# Patient Record
Sex: Female | Born: 1985 | Race: White | Hispanic: No | Marital: Single | Smoking: Current every day smoker
Health system: Southern US, Community
[De-identification: ages and names within clinical notes are randomized; demographics above are authoritative.]

## PROBLEM LIST (undated history)

## (undated) HISTORY — PX: WISDOM TOOTH EXTRACTION: SHX21

---

## 2016-05-21 ENCOUNTER — Ambulatory Visit (HOSPITAL_COMMUNITY)
Admission: RE | Admit: 2016-05-21 | Discharge: 2016-05-21 | Disposition: A | Discharge status: Home / Self Care | Payer: Self-pay | Attending: Psychiatry | Admitting: Psychiatry

## 2016-05-21 DIAGNOSIS — F411 Generalized anxiety disorder: Secondary | ICD-10-CM | POA: Insufficient documentation
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
## 2016-05-21 NOTE — H&P (Signed)
Behavioral Health Medical Screening Exam  Diana Meyer is an 30 y.o. female who presents as a walk in requesting help with depression and desiring outpatient resources for the [place] area. Reports medical history to include ovarian cysts. Denies SI/HI. Patient is stable to discharge with outpatient services.    Total Time spent with patient: 20 minutes  Psychiatric Specialty Exam: Physical Exam  Constitutional: She is oriented to person, place, and time. She appears well-developed and well-nourished.  HENT:  Head: Normocephalic and atraumatic.  Right Ear: External ear normal.  Left Ear: External ear normal.  Neck: Normal range of motion.  Cardiovascular: Normal rate, regular rhythm and normal heart sounds.   Respiratory: Effort normal and breath sounds normal.  GI: Soft. Bowel sounds are normal.  Musculoskeletal: Normal range of motion.  Neurological: She is alert and oriented to person, place, and time.  Skin: Skin is warm and dry.    Review of Systems  Constitutional: Negative for chills, fever and weight loss.  HENT: Negative for congestion, ear discharge, ear pain and sinus pain.   Eyes: Negative for blurred vision, double vision and pain.  Respiratory: Negative for cough, shortness of breath and wheezing.   Cardiovascular: Negative for chest pain, palpitations and leg swelling.  Gastrointestinal: Negative for abdominal pain, heartburn, nausea and vomiting.  Genitourinary: Negative for dysuria and urgency.  Musculoskeletal: Negative for myalgias and neck pain.  Skin: Negative for itching and rash.  Psychiatric/Behavioral: Positive for depression. The patient is nervous/anxious.     Blood pressure 114/81, pulse 94, temperature 99.1 F (37.3 C), resp. rate 18.There is no height or weight on file to calculate BMI.  General Appearance: Casual  Eye Contact:  Good  Speech:  Clear and Coherent  Volume:  Normal  Mood:  Anxious  Affect:  Appropriate  Thought Process:   Coherent and Goal Directed  Orientation:  Full (Time, Place, and Person)  Thought Content:  Symptoms, worries, concerns   Suicidal Thoughts:  No  Homicidal Thoughts:  No  Memory:  Immediate;   Good Recent;   Good Remote;   Good  Judgement:  Intact  Insight:  Present  Psychomotor Activity:  Normal  Concentration: Concentration: Good and Attention Span: Good  Recall:  Good  Fund of Knowledge:Good  Language: Good  Akathisia:  No  Handed:  Right  AIMS (if indicated):     Assets:  Communication Skills Desire for Improvement Housing Intimacy Leisure Time Physical Health Resilience Social Support  Sleep:       Musculoskeletal: Strength & Muscle Tone: within normal limits Gait & Station: normal Patient leans: N/A  Blood pressure 114/81, pulse 94, temperature 99.1 F (37.3 C), resp. rate 18.  Recommendations:  Based on my evaluation the patient does not appear to have an emergency medical condition.  Fransisca KaufmannAVIS, Waylin Dorko, NP 05/21/2016, 6:27 PM

## 2017-05-30 ENCOUNTER — Emergency Department (HOSPITAL_COMMUNITY)
Admission: EM | Admit: 2017-05-30 | Discharge: 2017-05-30 | Disposition: A | Discharge status: Home / Self Care | Payer: Self-pay | Attending: Emergency Medicine | Admitting: Emergency Medicine

## 2017-05-30 ENCOUNTER — Other Ambulatory Visit: Payer: Self-pay

## 2017-05-30 ENCOUNTER — Emergency Department (HOSPITAL_COMMUNITY): Payer: Self-pay

## 2017-05-30 ENCOUNTER — Encounter (HOSPITAL_COMMUNITY): Payer: Self-pay | Admitting: Emergency Medicine

## 2017-05-30 DIAGNOSIS — M25571 Pain in right ankle and joints of right foot: Secondary | ICD-10-CM | POA: Insufficient documentation

## 2017-05-30 DIAGNOSIS — S0083XA Contusion of other part of head, initial encounter: Secondary | ICD-10-CM | POA: Insufficient documentation

## 2017-05-30 DIAGNOSIS — Y929 Unspecified place or not applicable: Secondary | ICD-10-CM | POA: Insufficient documentation

## 2017-05-30 DIAGNOSIS — Z23 Encounter for immunization: Secondary | ICD-10-CM | POA: Insufficient documentation

## 2017-05-30 DIAGNOSIS — Z79899 Other long term (current) drug therapy: Secondary | ICD-10-CM | POA: Insufficient documentation

## 2017-05-30 DIAGNOSIS — G44319 Acute post-traumatic headache, not intractable: Secondary | ICD-10-CM | POA: Insufficient documentation

## 2017-05-30 DIAGNOSIS — Y999 Unspecified external cause status: Secondary | ICD-10-CM | POA: Insufficient documentation

## 2017-05-30 DIAGNOSIS — R109 Unspecified abdominal pain: Secondary | ICD-10-CM | POA: Insufficient documentation

## 2017-05-30 DIAGNOSIS — F17210 Nicotine dependence, cigarettes, uncomplicated: Secondary | ICD-10-CM | POA: Insufficient documentation

## 2017-05-30 DIAGNOSIS — M79621 Pain in right upper arm: Secondary | ICD-10-CM

## 2017-05-30 DIAGNOSIS — S41112A Laceration without foreign body of left upper arm, initial encounter: Secondary | ICD-10-CM | POA: Insufficient documentation

## 2017-05-30 DIAGNOSIS — M79642 Pain in left hand: Secondary | ICD-10-CM

## 2017-05-30 DIAGNOSIS — Y939 Activity, unspecified: Secondary | ICD-10-CM | POA: Insufficient documentation

## 2017-05-30 DIAGNOSIS — S300XXA Contusion of lower back and pelvis, initial encounter: Secondary | ICD-10-CM | POA: Insufficient documentation

## 2017-05-30 DIAGNOSIS — T07XXXA Unspecified multiple injuries, initial encounter: Secondary | ICD-10-CM

## 2017-05-30 LAB — RAPID URINE DRUG SCREEN, HOSP PERFORMED
AMPHETAMINES: POSITIVE — AB
BENZODIAZEPINES: POSITIVE — AB
Barbiturates: NOT DETECTED
Cocaine: NOT DETECTED
OPIATES: POSITIVE — AB
TETRAHYDROCANNABINOL: POSITIVE — AB

## 2017-05-30 LAB — COMPREHENSIVE METABOLIC PANEL
ALT: 14 U/L (ref 14–54)
AST: 24 U/L (ref 15–41)
Albumin: 4.3 g/dL (ref 3.5–5.0)
Alkaline Phosphatase: 59 U/L (ref 38–126)
Anion gap: 15 (ref 5–15)
BILIRUBIN TOTAL: 0.8 mg/dL (ref 0.3–1.2)
BUN: 14 mg/dL (ref 6–20)
CHLORIDE: 103 mmol/L (ref 101–111)
CO2: 22 mmol/L (ref 22–32)
CREATININE: 0.88 mg/dL (ref 0.44–1.00)
Calcium: 9.4 mg/dL (ref 8.9–10.3)
GFR calc Af Amer: >60 mL/min (ref >60)
Glucose, Bld: 86 mg/dL (ref 65–99)
POTASSIUM: 3.9 mmol/L (ref 3.5–5.1)
Sodium: 140 mmol/L (ref 135–145)
TOTAL PROTEIN: 7.4 g/dL (ref 6.5–8.1)

## 2017-05-30 LAB — URINALYSIS, ROUTINE W REFLEX MICROSCOPIC
Bilirubin Urine: NEGATIVE
GLUCOSE, UA: NEGATIVE mg/dL
HGB URINE DIPSTICK: NEGATIVE
Ketones, ur: 80 mg/dL — AB
NITRITE: NEGATIVE
PROTEIN: NEGATIVE mg/dL
pH: 5 (ref 5.0–8.0)

## 2017-05-30 LAB — CBC
HCT: 41 % (ref 36.0–46.0)
Hemoglobin: 14.1 g/dL (ref 12.0–15.0)
MCH: 31.1 pg (ref 26.0–34.0)
MCHC: 34.4 g/dL (ref 30.0–36.0)
MCV: 90.5 fL (ref 78.0–100.0)
Platelets: 369 10*3/uL (ref 150–400)
RBC: 4.53 MIL/uL (ref 3.87–5.11)
RDW: 14.5 % (ref 11.5–15.5)
WBC: 12.1 10*3/uL — AB (ref 4.0–10.5)

## 2017-05-30 LAB — I-STAT TROPONIN, ED: Troponin i, poc: 0 ng/mL (ref 0.00–0.08)

## 2017-05-30 LAB — ETHANOL

## 2017-05-30 LAB — LIPASE, BLOOD: Lipase: 17 U/L (ref 11–51)

## 2017-05-30 LAB — SALICYLATE LEVEL: SALICYLATE LVL: 24.1 mg/dL (ref 2.8–30.0)

## 2017-05-30 LAB — ACETAMINOPHEN LEVEL: Acetaminophen (Tylenol), Serum: <10 ug/mL — ABNORMAL LOW (ref 10–30)

## 2017-05-30 LAB — I-STAT BETA HCG BLOOD, ED (MC, WL, AP ONLY)

## 2017-05-30 MED ORDER — TETANUS-DIPHTH-ACELL PERTUSSIS 5-2.5-18.5 LF-MCG/0.5 IM SUSP
0.5000 mL | Freq: Once | INTRAMUSCULAR | Status: AC
Start: 2017-05-30 — End: 2017-05-30
  Administered 2017-05-30: 0.5 mL via INTRAMUSCULAR
  Filled 2017-05-30: qty 0.5

## 2017-05-30 MED ORDER — DIPHENHYDRAMINE HCL 50 MG/ML IJ SOLN
50.0000 mg | Freq: Once | INTRAMUSCULAR | Status: AC
  Administered 2017-05-30: 50 mg via INTRAVENOUS
  Filled 2017-05-30: qty 1

## 2017-05-30 MED ORDER — ONDANSETRON 4 MG PO TBDP
4.0000 mg | ORAL_TABLET | Freq: Once | ORAL | Status: AC | PRN
  Administered 2017-05-30: 4 mg via ORAL
  Filled 2017-05-30: qty 1

## 2017-05-30 MED ORDER — MORPHINE SULFATE (PF) 4 MG/ML IV SOLN
4.0000 mg | Freq: Once | INTRAVENOUS | Status: AC
  Administered 2017-05-30: 4 mg via INTRAVENOUS
  Filled 2017-05-30: qty 1

## 2017-05-30 MED ORDER — PROMETHAZINE HCL 25 MG/ML IJ SOLN
25.0000 mg | Freq: Once | INTRAMUSCULAR | Status: AC
  Administered 2017-05-30: 25 mg via INTRAVENOUS
  Filled 2017-05-30: qty 1

## 2017-05-30 MED ORDER — SODIUM CHLORIDE 0.9 % IV BOLUS (SEPSIS)
500.0000 mL | Freq: Once | INTRAVENOUS | Status: AC
  Administered 2017-05-30: 500 mL via INTRAVENOUS

## 2017-05-30 MED ORDER — OXYCODONE HCL 5 MG PO TABS: 5.0000 mg | ORAL_TABLET | Freq: Two times a day (BID) | ORAL | 0 refills | Status: DC | PRN

## 2017-05-30 MED ORDER — IOPAMIDOL (ISOVUE-300) INJECTION 61%
INTRAVENOUS | Status: AC
  Administered 2017-05-30: 100 mL
  Filled 2017-05-30: qty 100

## 2017-05-30 MED ORDER — ONDANSETRON HCL 4 MG PO TABS: 4.0000 mg | ORAL_TABLET | Freq: Three times a day (TID) | ORAL | 0 refills | Status: DC | PRN

## 2017-05-30 MED ORDER — LORAZEPAM 2 MG/ML IJ SOLN
0.5000 mg | Freq: Once | INTRAMUSCULAR | Status: AC
  Administered 2017-05-30: 0.5 mg via INTRAVENOUS
  Filled 2017-05-30: qty 1

## 2017-05-30 MED ORDER — DEXAMETHASONE SODIUM PHOSPHATE 10 MG/ML IJ SOLN
10.0000 mg | Freq: Once | INTRAMUSCULAR | Status: AC
  Administered 2017-05-30: 10 mg via INTRAVENOUS
  Filled 2017-05-30: qty 1

## 2017-05-30 MED ORDER — PROCHLORPERAZINE EDISYLATE 5 MG/ML IJ SOLN
10.0000 mg | Freq: Once | INTRAMUSCULAR | Status: AC
  Administered 2017-05-30: 10 mg via INTRAVENOUS
  Filled 2017-05-30: qty 2

## 2017-05-30 NOTE — Discharge Instructions (Addendum)
Alternate 600 mg of ibuprofen and [phone] mg of Tylenol every 3 hours as needed for pain. Do not exceed 4000 mg of Tylenol daily.  Take oxycodone as needed for severe pain but do not drive, drink alcohol, or operate heavy machinery while on this medication.  Zofran as needed for nausea.  Apply ice or heat for comfort.  Take hot baths or hot showers for relief.  Keep the splint on at all times for the next 2 weeks until reevaluation by hand surgery and repeat x-rays.  Keep the wound to your left hand clean and dry, apply antibiotic ointment and cover with bandage.  Return to the ED immediately if any concerning signs or symptoms develop such as slurred speech, passing out, numbness, weakness, fevers, chills, or abnormal redness or drainage.

## 2017-05-30 NOTE — ED Notes (Signed)
Discussed with [place], PA that patient denied suicidal ideation or attempt. Also, stated to PA. Suicidal precautions discontinued.
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
## 2017-05-30 NOTE — ED Notes (Signed)
Ortho paged again for thumb spica.

## 2017-05-30 NOTE — ED Notes (Signed)
Patient denies thoughts of harming self at this time.

## 2017-05-30 NOTE — ED Provider Notes (Signed)
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 right ankle pain  Pain in right upper arm  Laceration of multiple sites of left upper extremity, initial encounter    ED Discharge Orders        Ordered    oxyCODONE (ROXICODONE) 5 MG immediate release tablet  Every 12 hours PRN     05/30/17 1632    ondansetron (ZOFRAN) 4 MG tablet  Every 8 hours PRN     05/30/17 1632       Jeanie Sewer, PA-C 05/31/17 0556    Gerhard Munch, MD 05/31/17 [phone]

## 2017-05-30 NOTE — ED Notes (Signed)
Patient transported to CT & xray via stretcher.  

## 2017-05-30 NOTE — Progress Notes (Signed)
Orthopedic Tech Progress Note Patient Details:  Diana Meyer [birth year] [id number]  Ortho Devices Type of Ortho Device: Thumb velcro splint Splint Material: Other (comment) Ortho Device/Splint Location: Left  Ortho Device/Splint Interventions: Application, Adjustment   Alvina ChouWilliams, Mandolin Falwell C 05/30/2017, 5:39 PM

## 2017-05-30 NOTE — ED Notes (Signed)
Ortho at bedside at this time.

## 2018-02-03 ENCOUNTER — Emergency Department (HOSPITAL_BASED_OUTPATIENT_CLINIC_OR_DEPARTMENT_OTHER)
Admission: EM | Admit: 2018-02-03 | Discharge: 2018-02-03 | Disposition: A | Discharge status: Home / Self Care | Payer: Self-pay | Attending: Emergency Medicine | Admitting: Emergency Medicine

## 2018-02-03 ENCOUNTER — Encounter (HOSPITAL_BASED_OUTPATIENT_CLINIC_OR_DEPARTMENT_OTHER): Payer: Self-pay | Admitting: Emergency Medicine

## 2018-02-03 ENCOUNTER — Other Ambulatory Visit: Payer: Self-pay

## 2018-02-03 DIAGNOSIS — R112 Nausea with vomiting, unspecified: Secondary | ICD-10-CM | POA: Insufficient documentation

## 2018-02-03 DIAGNOSIS — F17210 Nicotine dependence, cigarettes, uncomplicated: Secondary | ICD-10-CM | POA: Insufficient documentation

## 2018-02-03 DIAGNOSIS — R197 Diarrhea, unspecified: Secondary | ICD-10-CM | POA: Insufficient documentation

## 2018-02-03 DIAGNOSIS — Z3202 Encounter for pregnancy test, result negative: Secondary | ICD-10-CM | POA: Insufficient documentation

## 2018-02-03 LAB — CBC
HCT: 39 % (ref 36.0–46.0)
HEMOGLOBIN: 13.6 g/dL (ref 12.0–15.0)
MCH: 32.6 pg (ref 26.0–34.0)
MCHC: 34.9 g/dL (ref 30.0–36.0)
MCV: 93.5 fL (ref 78.0–100.0)
PLATELETS: 298 10*3/uL (ref 150–400)
RBC: 4.17 MIL/uL (ref 3.87–5.11)
RDW: 12.7 % (ref 11.5–15.5)
WBC: 10.6 10*3/uL — ABNORMAL HIGH (ref 4.0–10.5)

## 2018-02-03 LAB — URINALYSIS, ROUTINE W REFLEX MICROSCOPIC
BILIRUBIN URINE: NEGATIVE
Glucose, UA: NEGATIVE mg/dL
Hgb urine dipstick: NEGATIVE
Ketones, ur: NEGATIVE mg/dL
Leukocytes, UA: NEGATIVE
NITRITE: NEGATIVE
PROTEIN: NEGATIVE mg/dL
Specific Gravity, Urine: 1.015 (ref 1.005–1.030)
pH: 7 (ref 5.0–8.0)

## 2018-02-03 LAB — COMPREHENSIVE METABOLIC PANEL
ALT: 9 U/L (ref 0–44)
ANION GAP: 8 (ref 5–15)
AST: 18 U/L (ref 15–41)
Albumin: 4.1 g/dL (ref 3.5–5.0)
Alkaline Phosphatase: 33 U/L — ABNORMAL LOW (ref 38–126)
BUN: 17 mg/dL (ref 6–20)
CALCIUM: 8.6 mg/dL — AB (ref 8.9–10.3)
CO2: 23 mmol/L (ref 22–32)
CREATININE: 1.04 mg/dL — AB (ref 0.44–1.00)
Chloride: 108 mmol/L (ref 98–111)
GFR calc non Af Amer: >60 mL/min (ref >60)
GLUCOSE: 95 mg/dL (ref 70–99)
Potassium: 4 mmol/L (ref 3.5–5.1)
SODIUM: 139 mmol/L (ref 135–145)
TOTAL PROTEIN: 7.2 g/dL (ref 6.5–8.1)
Total Bilirubin: 0.3 mg/dL (ref 0.3–1.2)

## 2018-02-03 LAB — LIPASE, BLOOD: Lipase: 30 U/L (ref 11–51)

## 2018-02-03 LAB — PREGNANCY, URINE: PREG TEST UR: NEGATIVE

## 2018-02-03 MED ORDER — SODIUM CHLORIDE 0.9 % IV BOLUS
1000.0000 mL | Freq: Once | INTRAVENOUS | Status: AC
Start: 2018-02-03 — End: 2018-02-03
  Administered 2018-02-03: 1000 mL via INTRAVENOUS

## 2018-02-03 MED ORDER — ONDANSETRON HCL 4 MG/2ML IJ SOLN
4.0000 mg | Freq: Once | INTRAMUSCULAR | Status: AC
  Administered 2018-02-03: 4 mg via INTRAVENOUS
  Filled 2018-02-03: qty 2

## 2018-02-03 MED ORDER — ONDANSETRON 4 MG PO TBDP: 4.0000 mg | ORAL_TABLET | Freq: Three times a day (TID) | ORAL | 0 refills | Status: DC | PRN

## 2018-02-03 MED ORDER — DICYCLOMINE HCL 20 MG PO TABS: 20.0000 mg | ORAL_TABLET | Freq: Three times a day (TID) | ORAL | 0 refills | Status: DC | PRN

## 2018-02-03 NOTE — ED Triage Notes (Signed)
Reports nausea, vomiting, diarrhea x 2 days with headache.  Denies abdominal pain.  C/o lower back pain.

## 2018-02-03 NOTE — ED Notes (Signed)
Pt has gingerale for PO challenge

## 2018-02-03 NOTE — Discharge Instructions (Signed)

## 2018-02-03 NOTE — ED Notes (Signed)
N/v/d started feeling bad yesterday lower abd pain, denies dysuria, lmp  7/9 states normal , no BC she states

## 2018-02-03 NOTE — ED Provider Notes (Signed)
Emergency Department Provider Note   I have reviewed the triage vital signs and the nursing notes.   HISTORY  Chief Complaint Emesis   HPI Diana Meyer is a 32 y.o. female presents to the emergency department with nausea, vomiting, diarrhea for the past 2 days.  She has an associated headache.  She endorses some abdominal cramping prior to vomiting but otherwise with no abdominal pain.  So endorses some lower back discomfort.  She denies any dysuria, hesitancy, urgency.  She is experienced some subjective fevers at home but did not have a thermometer to confirm.  No known sick contacts.  She is been trying over-the-counter medications and ginger ale with no relief in nausea symptoms.  No recent antibiotics.  No recent travel.   History reviewed. No pertinent past medical history.  There are no active problems to display for this patient.   Past Surgical History:  Procedure Laterality Date  . WISDOM TOOTH EXTRACTION     Allergies Patient has no known allergies.  History reviewed. No pertinent family history.  Social History Social History   Tobacco Use  . Smoking status: Current Every Day Smoker    Packs/day: 1.00    Types: Cigarettes  . Smokeless tobacco: Never Used  Substance Use Topics  . Alcohol use: Yes    Comment: "not really on a regular basis"  . 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 ENT: No sore throat. Cardiovascular: Denies chest pain. Respiratory: Denies shortness of breath. Gastrointestinal: No abdominal pain. Positive nausea, vomiting, and  diarrhea.  No constipation. Genitourinary: Negative for dysuria. Musculoskeletal: Negative for back pain. Skin: Negative for rash. Neurological: Negative for headaches, focal weakness or numbness.  10-point ROS otherwise  negative.  ____________________________________________   PHYSICAL EXAM:  VITAL SIGNS: ED Triage Vitals [02/03/18 1157]  Enc Vitals Group     BP 123/87     Pulse Rate 84     Resp 18     Temp 98.2 F (36.8 C)     Temp Source Oral     SpO2 99 %     Weight 140 lb (63.5 kg)     Height 5\' 2"  (1.575 m)     Pain Score 3   Constitutional: Alert and oriented. Well appearing and in no acute distress. Eyes: Conjunctivae are normal.  Head: Atraumatic. Nose: No congestion/rhinnorhea. Mouth/Throat: Mucous membranes are dry.  Neck: No stridor.  Cardiovascular: Normal rate, regular rhythm. Good peripheral circulation. Grossly normal heart sounds.   Respiratory: Normal respiratory effort.  No retractions. Lungs CTAB. Gastrointestinal: Soft and nontender. No distention.  Musculoskeletal: No lower extremity tenderness nor edema. No gross deformities of extremities. Neurologic:  Normal speech and language. No gross focal neurologic deficits are appreciated.  Skin:  Skin is warm, dry and intact. No rash noted.  ____________________________________________   LABS (all labs ordered are listed, but only abnormal results are displayed)  Labs Reviewed  COMPREHENSIVE METABOLIC PANEL - Abnormal; Notable for the following components:      Result Value   Creatinine, Ser 1.04 (*)    Calcium 8.6 (*)    Alkaline Phosphatase 33 (*)    All other components within normal limits  CBC - Abnormal; Notable for the following components:   WBC 10.6 (*)    All other components  within normal limits  LIPASE, BLOOD  URINALYSIS, ROUTINE W REFLEX MICROSCOPIC  PREGNANCY, URINE   ____________________________________________  RADIOLOGY  None ____________________________________________   PROCEDURES  Procedure(s) performed:   Procedures  None ____________________________________________   INITIAL IMPRESSION / ASSESSMENT AND PLAN / ED COURSE  Pertinent labs & imaging results that were available  during my care of the patient were reviewed by me and considered in my medical decision making (see chart for details).  She presents to the emergency department with nausea, vomiting, diarrhea.  She has some cramping abdominal pain prior to vomiting but otherwise no abdominal discomfort.  Her abdomen is soft and nontender.  Denies any pelvic pain, UTI symptoms, or vaginal bleeding/discharge.  Plan for baseline labs, IV fluids, symptom management, and reassess.   Patient with normal labs after review. She is feeling much better after IVF and Zofran. She is tolerating PO. Plan for discharge.   At this time, I do not feel there is any life-threatening condition present. I have reviewed and discussed all results (EKG, imaging, lab, urine as appropriate), exam findings with patient. I have reviewed nursing notes and appropriate previous records.  I feel the patient is safe to be discharged home without further emergent workup. Discussed usual and customary return precautions. Patient and family (if present) verbalize understanding and are comfortable with this plan.  Patient will follow-up with their primary care provider. If they do not have a primary care provider, information for follow-up has been provided to them. All questions have been answered.  ____________________________________________  FINAL CLINICAL IMPRESSION(S) / ED DIAGNOSES  Final diagnoses:  Nausea vomiting and diarrhea     MEDICATIONS GIVEN DURING THIS VISIT:  Medications  sodium chloride 0.9 % bolus 1,000 mL (0 mLs Intravenous Stopped 02/03/18 1400)  ondansetron (ZOFRAN) injection 4 mg (4 mg Intravenous Given 02/03/18 1313)     NEW OUTPATIENT MEDICATIONS STARTED DURING THIS VISIT:  Discharge Medication List as of 02/03/2018  1:53 PM    START taking these medications   Details  dicyclomine (BENTYL) 20 MG tablet Take 1 tablet (20 mg total) by mouth every 8 (eight) hours as needed for spasms (and abdominal cramping).,  Starting Tue 02/03/2018, Print    ondansetron (ZOFRAN ODT) 4 MG disintegrating tablet Take 1 tablet (4 mg total) by mouth every 8 (eight) hours as needed for nausea or vomiting., Starting Tue 02/03/2018, Print        Note:  This document was prepared using Dragon voice recognition software and may include unintentional dictation errors.  Alona Bene, MD Emergency Medicine    Breanne Olvera, Arlyss Repress, MD 02/03/18 Barry Brunner

## 2019-03-11 DIAGNOSIS — F1910 Other psychoactive substance abuse, uncomplicated: Secondary | ICD-10-CM | POA: Insufficient documentation

## 2019-03-11 DIAGNOSIS — F319 Bipolar disorder, unspecified: Secondary | ICD-10-CM | POA: Insufficient documentation

## 2019-04-26 ENCOUNTER — Emergency Department (HOSPITAL_COMMUNITY): Payer: BC Managed Care – PPO

## 2019-04-26 ENCOUNTER — Encounter (HOSPITAL_BASED_OUTPATIENT_CLINIC_OR_DEPARTMENT_OTHER): Payer: Self-pay | Admitting: *Deleted

## 2019-04-26 ENCOUNTER — Emergency Department (HOSPITAL_BASED_OUTPATIENT_CLINIC_OR_DEPARTMENT_OTHER)
Admission: EM | Admit: 2019-04-26 | Discharge: 2019-04-26 | Disposition: A | Discharge status: Home / Self Care | Payer: BC Managed Care – PPO | Attending: Emergency Medicine | Admitting: Emergency Medicine

## 2019-04-26 ENCOUNTER — Other Ambulatory Visit: Payer: Self-pay

## 2019-04-26 ENCOUNTER — Emergency Department (HOSPITAL_BASED_OUTPATIENT_CLINIC_OR_DEPARTMENT_OTHER): Payer: BC Managed Care – PPO

## 2019-04-26 DIAGNOSIS — Z79899 Other long term (current) drug therapy: Secondary | ICD-10-CM | POA: Insufficient documentation

## 2019-04-26 DIAGNOSIS — R1031 Right lower quadrant pain: Secondary | ICD-10-CM

## 2019-04-26 DIAGNOSIS — R102 Pelvic and perineal pain: Secondary | ICD-10-CM

## 2019-04-26 DIAGNOSIS — F17210 Nicotine dependence, cigarettes, uncomplicated: Secondary | ICD-10-CM | POA: Diagnosis not present

## 2019-04-26 DIAGNOSIS — Z3202 Encounter for pregnancy test, result negative: Secondary | ICD-10-CM | POA: Insufficient documentation

## 2019-04-26 DIAGNOSIS — Z20828 Contact with and (suspected) exposure to other viral communicable diseases: Secondary | ICD-10-CM | POA: Insufficient documentation

## 2019-04-26 DIAGNOSIS — N8301 Follicular cyst of right ovary: Secondary | ICD-10-CM | POA: Insufficient documentation

## 2019-04-26 LAB — URINALYSIS, ROUTINE W REFLEX MICROSCOPIC
Bilirubin Urine: NEGATIVE
Glucose, UA: NEGATIVE mg/dL
Hgb urine dipstick: NEGATIVE
Ketones, ur: NEGATIVE mg/dL
Leukocytes,Ua: NEGATIVE
Nitrite: NEGATIVE
Protein, ur: NEGATIVE mg/dL
Specific Gravity, Urine: 1.01 (ref 1.005–1.030)
pH: 7 (ref 5.0–8.0)

## 2019-04-26 LAB — CBC
HCT: 40.2 % (ref 36.0–46.0)
Hemoglobin: 13.1 g/dL (ref 12.0–15.0)
MCH: 30.6 pg (ref 26.0–34.0)
MCHC: 32.6 g/dL (ref 30.0–36.0)
MCV: 93.9 fL (ref 80.0–100.0)
Platelets: 361 10*3/uL (ref 150–400)
RBC: 4.28 MIL/uL (ref 3.87–5.11)
RDW: 12.7 % (ref 11.5–15.5)
WBC: 8.7 10*3/uL (ref 4.0–10.5)
nRBC: 0 % (ref 0.0–0.2)

## 2019-04-26 LAB — PREGNANCY, URINE: Preg Test, Ur: NEGATIVE

## 2019-04-26 LAB — COMPREHENSIVE METABOLIC PANEL
ALT: 11 U/L (ref 0–44)
AST: 15 U/L (ref 15–41)
Albumin: 4.2 g/dL (ref 3.5–5.0)
Alkaline Phosphatase: 40 U/L (ref 38–126)
Anion gap: 12 (ref 5–15)
BUN: 16 mg/dL (ref 6–20)
CO2: 24 mmol/L (ref 22–32)
Calcium: 8.9 mg/dL (ref 8.9–10.3)
Chloride: 101 mmol/L (ref 98–111)
Creatinine, Ser: 0.89 mg/dL (ref 0.44–1.00)
GFR calc Af Amer: >60 mL/min (ref >60)
GFR calc non Af Amer: >60 mL/min (ref >60)
Glucose, Bld: 94 mg/dL (ref 70–99)
Potassium: 3.9 mmol/L (ref 3.5–5.1)
Sodium: 137 mmol/L (ref 135–145)
Total Bilirubin: 0.3 mg/dL (ref 0.3–1.2)
Total Protein: 7.9 g/dL (ref 6.5–8.1)

## 2019-04-26 LAB — WET PREP, GENITAL
Sperm: NONE SEEN
Trich, Wet Prep: NONE SEEN

## 2019-04-26 LAB — SARS CORONAVIRUS 2 BY RT PCR (HOSPITAL ORDER, PERFORMED IN ~~LOC~~ HOSPITAL LAB): SARS Coronavirus 2: NEGATIVE

## 2019-04-26 LAB — LIPASE, BLOOD: Lipase: 25 U/L (ref 11–51)

## 2019-04-26 MED ORDER — KETOROLAC TROMETHAMINE 30 MG/ML IJ SOLN
30.0000 mg | Freq: Once | INTRAMUSCULAR | Status: AC
  Administered 2019-04-26: 30 mg via INTRAVENOUS
  Filled 2019-04-26: qty 1

## 2019-04-26 MED ORDER — MORPHINE SULFATE (PF) 4 MG/ML IV SOLN
4.0000 mg | Freq: Once | INTRAVENOUS | Status: AC
  Administered 2019-04-26: 4 mg via INTRAVENOUS
  Filled 2019-04-26: qty 1

## 2019-04-26 MED ORDER — HYDROCODONE-ACETAMINOPHEN 5-325 MG PO TABS: 1.0000 | ORAL_TABLET | Freq: Once | ORAL | Status: DC

## 2019-04-26 MED ORDER — HYDROCODONE-ACETAMINOPHEN 5-325 MG PO TABS: 1.0000 | ORAL_TABLET | ORAL | 0 refills | Status: DC | PRN

## 2019-04-26 MED ORDER — IOHEXOL 300 MG/ML  SOLN
100.0000 mL | Freq: Once | INTRAMUSCULAR | Status: AC
  Administered 2019-04-26: 100 mL via INTRAVENOUS

## 2019-04-26 NOTE — ED Notes (Signed)
Pt verbalizes understanding of transfer to [facility] ED for [place]. Pt requesting to go POV and not by hospital transport. MD discussed risk of POV, pt agrees.

## 2019-04-26 NOTE — ED Provider Notes (Signed)
[facility] EMERGENCY DEPARTMENT Provider Note   CSN: [id number] Arrival date & time: 04/26/19  1328     History   Chief Complaint Chief Complaint  Patient presents with   Abdominal Pain    HPI Diana Meyer is a 33 y.o. female who presents to ED sharp right lower quadrant abdominal pain since yesterday.  States the pain is worse with palpation and only mildly improved with ibuprofen.  Pain got worse while at work today.  She has a history of ruptured ovarian cyst on the right side in the past and states this feels similar.  She initially thought it was due to menstrual cramps.  She denies any abnormal vaginal discharge or abnormal vaginal bleeding.  She denies any changes to bowel movements, does report nausea but denies any vomiting.  Denies any concern for STDs, fever, prior abdominal surgeries, sick contacts with similar symptoms, dysuria, possibility of pregnancy.     HPI  History reviewed. No pertinent past medical history.  There are no active problems to display for this patient.   Past Surgical History:  Procedure Laterality Date   WISDOM TOOTH EXTRACTION       OB History    Gravida  1   Para      Term      Preterm      AB      Living        SAB      TAB      Ectopic      Multiple      Live Births               Home Medications    Prior to Admission medications   Medication Sig Start Date End Date Taking? Authorizing Provider  acetaminophen (TYLENOL) 325 MG tablet Take 650 mg by mouth every 6 (six) hours as needed for mild pain.    [provider]  dicyclomine (BENTYL) 20 MG tablet Take 1 tablet (20 mg total) by mouth every 8 (eight) hours as needed for spasms (and abdominal cramping). 02/03/18   Long, Wonda Olds, MD  diphenhydrAMINE (BENADRYL) 25 MG tablet Take 25 mg by mouth every 6 (six) hours as needed for allergies.    [provider]  ibuprofen (ADVIL,MOTRIN) 200 MG tablet Take 200 mg by mouth every  6 (six) hours as needed for moderate pain.    [provider]  lamoTRIgine (LAMICTAL) 100 MG tablet Take 300 mg by mouth daily.    [provider]  ondansetron (ZOFRAN ODT) 4 MG disintegrating tablet Take 1 tablet (4 mg total) by mouth every 8 (eight) hours as needed for nausea or vomiting. 02/03/18   Long, Wonda Olds, MD  ondansetron (ZOFRAN) 4 MG tablet Take 1 tablet (4 mg total) by mouth every 8 (eight) hours as needed for nausea or vomiting. 05/30/17   Fawze, Mina A, PA-C  oxyCODONE (ROXICODONE) 5 MG immediate release tablet Take 1 tablet (5 mg total) by mouth every 12 (twelve) hours as needed for severe pain. 05/30/17   Renita Papa, PA-C    Family History No family history on file.  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   Review of Systems Review of Systems  Constitutional: Negative for appetite change, chills and fever.  HENT: Negative for ear pain, rhinorrhea, sneezing and sore throat.   Eyes: Negative for photophobia and visual disturbance.  Respiratory: Negative for cough, chest tightness, shortness of breath and wheezing.   Cardiovascular: Negative for chest pain and palpitations.  Gastrointestinal: Positive for abdominal pain. Negative for blood in stool, constipation, diarrhea, nausea and vomiting.  Genitourinary: Positive for pelvic pain. Negative for dysuria, hematuria and urgency.  Musculoskeletal: Negative for myalgias.  Skin: Negative for rash.  Neurological: Negative for dizziness, weakness and light-headedness.     Physical Exam Updated Vital Signs BP  126/81 (BP Location: Right Arm)    Pulse 91    Temp 98.7 F (37.1 C) (Oral)    Resp 20    Ht 5\' 2"  (1.575 m)    Wt 72.6 kg    LMP 03/31/2019    SpO2 97%    BMI 29.26 kg/m   Physical Exam Vitals signs and nursing note reviewed. Exam conducted with a chaperone present.  Constitutional:      General: She is not in acute distress.    Appearance: She is well-developed.  HENT:     Head: Normocephalic and atraumatic.     Nose: Nose normal.  Eyes:     General: No scleral icterus.       Left eye: No discharge.     Conjunctiva/sclera: Conjunctivae normal.  Neck:     Musculoskeletal: Normal range of motion and neck supple.  Cardiovascular:     Rate and Rhythm: Normal rate and regular rhythm.     Heart sounds: Normal heart sounds. No murmur. No friction rub. No gallop.   Pulmonary:     Effort: Pulmonary effort is normal. No respiratory distress.     Breath sounds: Normal breath sounds.  Abdominal:     General: Bowel sounds are normal. There is no distension.     Palpations: Abdomen is soft.     Tenderness: There is abdominal tenderness in the right lower quadrant. There is no guarding.  Genitourinary:    Vagina: Vaginal discharge present.     Cervix: No cervical motion tenderness.     Adnexa:        Right: Tenderness present.        Left: No tenderness.       Comments: Pelvic exam: normal external genitalia without evidence of trauma. VULVA: normal appearing vulva with no masses, tenderness or lesion. VAGINA: normal appearing vagina with normal color and discharge, no lesions. CERVIX: normal appearing cervix without lesions, cervical motion tenderness absent, cervical os closed with out purulent discharge; No vaginal discharge. Wet prep and DNA probe for chlamydia and GC obtained.   ADNEXA: Right adnexal tenderness. UTERUS: uterus is normal size, shape, consistency and nontender.    Musculoskeletal: Normal range of motion.  Skin:    General: Skin is warm and dry.     Findings: No  rash.  Neurological:     Mental Status: She is alert.     Motor: No abnormal muscle tone.     Coordination: Coordination normal.      ED Treatments / Results  Labs (all labs ordered are listed, but only abnormal results are displayed) Labs Reviewed  WET PREP, GENITAL - Abnormal; Notable for the  following components:      Result Value   Yeast Wet Prep HPF POC PRESENT (*)    Clue Cells Wet Prep HPF POC PRESENT (*)    WBC, Wet Prep HPF POC MANY (*)    All other components within normal limits  LIPASE, BLOOD  COMPREHENSIVE METABOLIC PANEL  CBC  URINALYSIS, ROUTINE W REFLEX MICROSCOPIC  PREGNANCY, URINE  GC/CHLAMYDIA PROBE AMP ([place]) NOT AT [facility]    EKG None  Radiology Ct Abdomen Pelvis W Contrast  Result Date: 04/26/2019 CLINICAL DATA:  Right lower abdominal pain since last night. Hx of ovarian cyst EXAM: CT ABDOMEN AND PELVIS WITH CONTRAST TECHNIQUE: Multidetector CT imaging of the abdomen and pelvis was performed using the standard protocol following bolus administration of intravenous contrast. CONTRAST:  OMNIPAQUE IOHEXOL 300 MG/ML  SOLN COMPARISON:  None. FINDINGS: Lower chest: Minimal bibasilar atelectasis. No pleural effusion. Hepatobiliary: No focal liver abnormality is seen. No gallstones, gallbladder wall thickening, or biliary dilatation. Pancreas: Unremarkable. No pancreatic ductal dilatation or surrounding inflammatory changes. Spleen: Normal in size without focal abnormality. Adrenals/Urinary Tract: Adrenal glands are unremarkable. There is a 3 mm calculus in the inferior pole the left kidney. No hydronephrosis. No right renal calculi. No renal masses. Bladder is unremarkable. Stomach/Bowel: Stomach is within normal limits. Appendix appears normal. No evidence of bowel wall thickening, distention, or inflammatory changes. Vascular/Lymphatic: No significant vascular findings are present. No enlarged abdominal or pelvic lymph nodes. Reproductive: Uterus and  bilateral adnexa are unremarkable. Other: No abdominal wall hernia or abnormality. No abdominopelvic ascites. Musculoskeletal: A few tiny sclerotic foci in the pelvis likely represent bone islands. No acute osseous finding. IMPRESSION: 1. No acute findings in the abdomen or pelvis to explain the patient's symptoms. 2. Nonobstructive left nephrolithiasis. Electronically Signed   By: Emmaline Kluver M.D.   On: 04/26/2019 17:11    Procedures Procedures (including critical care time)  Medications Ordered in ED Medications  ketorolac (TORADOL) 30 MG/ML injection 30 mg (has no administration in time range)  morphine 4 MG/ML injection 4 mg (4 mg Intravenous Given 04/26/19 1622)  iohexol (OMNIPAQUE) 300 MG/ML solution 100 mL (100 mLs Intravenous Contrast Given 04/26/19 1656)     Initial Impression / Assessment and Plan / ED Course  I have reviewed the triage vital signs and the nursing notes.  Pertinent labs & imaging results that were available during my care of the patient were reviewed by me and considered in my medical decision making (see chart for details).        33 year old female presents to the ED for right lower quadrant abdominal pain since yesterday. Symptoms have gotten worse today since yesterday and only mildly improved with NSAIDs. Initially thought it was due to menstrual cramps but when symptoms worsened she states it felt similar to the last time she had a ruptured R ovarian cyst. She appears uncomfortable on my exam, TTP of right lower quadrant as well as adnexal tenderness on pelvic exam.  Vital signs are within normal limits.  Lab work including CBC, CMP, urine pregnancy and urinalysis unremarkable.  Wet prep shows yeast and clue cells.  CT of the abdomen pelvis without contrast without any findings that could explain her symptoms.  Patient will need pelvic ultrasound to rule out ovarian torsion.  Unfortunately ultrasound not available at this facility at this time.  Will  transfer to [facility] for pelvic ultrasound.  If unremarkable and patient feels improved, will discharge home with pain control, GYN and PCP  follow-up. Dr. Stevie Kern will accept the patient in transfer at [facility] ED. I have also spoken to charge nurse Marijean Niemann at [facility] who states that she will look out for the patient and have the ultrasound done when she gets there. Patient decides to go POV, risks and benefits explained regarding transfer and patient agrees.  Final Clinical Impressions(s) / ED Diagnoses   Final diagnoses:  Pelvic pain in female    ED Discharge Orders    None      Portions of this note were generated with Dragon dictation software. Dictation errors may occur despite best attempts at proofreading.    Dietrich Pates, PA-C 04/26/19 1746    Tegeler, Canary Brim, MD 04/27/19 [phone]

## 2019-04-26 NOTE — ED Triage Notes (Signed)
Right lower abdominal pain since last night. Hx of ovarian cyst. She was told to come here for possible ovarian cyst.

## 2019-04-26 NOTE — ED Notes (Signed)
Pt reports taking 800 mg ibuprofen this morning around 5 or 6 for the pain with constant nausea.

## 2019-04-26 NOTE — Discharge Instructions (Addendum)
Take the pain medicine as prescribed.  Follow-up with your OB/GYN if you develop recurrent pain or return to the ED.

## 2019-04-26 NOTE — ED Provider Notes (Signed)
Transferred from [place], [place].  See note for full HPI.  Patient presented for evaluation of abdominal pain worse to the right lower quadrant since yesterday.  She does have history of ovarian cysts.  States this feels similar.  Had negative CT scan which did not show any evidence of appendicitis or acute AP pathology.  She also had wet prep consistent with yeast, BV and many WBC.  No CMT tenderness however did have right adnexal tenderness on pelvic exam.  Labs and urinalysis negative for acute pathology.  She transferred for ultrasound to rule out torsion and pelvic pathology.  And if pain controlled and no acute findings DC home with pain medicine. Physical Exam  BP (!) 128/95 (BP Location: Right Arm)   Pulse 88   Temp 98.4 F (36.9 C) (Oral)   Resp 18   Ht 5\' 2"  (1.575 m)   Wt 72.6 kg   LMP 03/31/2019   SpO2 97%   BMI 29.26 kg/m   Physical Exam Vitals signs and nursing note reviewed.  Constitutional:      General: She is not in acute distress.    Appearance: She is well-developed. She is not ill-appearing, toxic-appearing or diaphoretic.  HENT:     Head: Normocephalic and atraumatic.     Mouth/Throat:     Mouth: Mucous membranes are moist.     Pharynx: Oropharynx is clear.  Eyes:     Pupils: Pupils are equal, round, and reactive to light.  Neck:     Musculoskeletal: Normal range of motion.  Cardiovascular:     Rate and Rhythm: Normal rate.     Pulses: Normal pulses.     Heart sounds: Normal heart sounds.  Pulmonary:     Effort: Pulmonary effort is normal. No respiratory distress.     Breath sounds: Normal breath sounds.  Abdominal:     General: Bowel sounds are normal. There is no distension.     Comments: Mild tenderness to right lower quadrant without rebound or guarding.  Negative McBurney point, psoas, operator sign.  Genitourinary:    Comments: Performed by previous provider. See note Musculoskeletal: Normal range of motion.  Skin:     General: Skin is warm and dry.     Capillary Refill: Capillary refill takes less than 2 seconds.  Neurological:     General: No focal deficit present.     Mental Status: She is alert and oriented to person, place, and time.     Gait: Gait normal.     ED Course/Procedures    Labs Reviewed  WET PREP, GENITAL - Abnormal; Notable for the following components:      Result Value   Yeast Wet Prep HPF POC PRESENT (*)    Clue Cells Wet Prep HPF POC PRESENT (*)    WBC, Wet Prep HPF POC MANY (*)    All other components within normal limits  SARS CORONAVIRUS 2 BY RT PCR (HOSPITAL ORDER, [facility])  LIPASE, BLOOD  COMPREHENSIVE METABOLIC PANEL  CBC  URINALYSIS, ROUTINE W REFLEX MICROSCOPIC  PREGNANCY, URINE  GC/CHLAMYDIA PROBE AMP ([place]) NOT AT [facility]   Procedures US Transvaginal Non-ob  Result Date: 04/26/2019 CLINICAL DATA:  Right lower quadrant pain EXAM: TRANSABDOMINAL AND TRANSVAGINAL ULTRASOUND OF PELVIS DOPPLER ULTRASOUND OF OVARIES TECHNIQUE: Both transabdominal and transvaginal ultrasound examinations of the pelvis were performed. Transabdominal technique was performed for global imaging of the pelvis including uterus, ovaries, adnexal regions, and pelvic cul-de-sac. It was  necessary to proceed with endovaginal exam following the transabdominal exam to visualize the uterus endometrium ovaries. Color and duplex Doppler ultrasound was utilized to evaluate blood flow to the ovaries. COMPARISON:  CT 04/26/2019 FINDINGS: Uterus Measurements: 7 x 3.9 x 4.5 cm = volume: 63.6 mL. No fibroids or other mass visualized. Endometrium Thickness: 9.4 mm.  No focal abnormality visualized. Right ovary Measurements: 2.9 x 1.9 x 2.3 cm = volume: 6.5 mL. 8 mm possible hemorrhagic follicle. Left ovary Measurements: 2.6 x 1.6 x 2.2 cm = volume: 4.7 mL. Normal appearance/no adnexal mass. Pulsed Doppler evaluation of both ovaries demonstrates normal low-resistance arterial and  venous waveforms. Other findings Trace free fluid IMPRESSION: 1. Negative for ovarian torsion or suspicious ovarian mass lesion. 2. Trace free fluid in the pelvis Electronically Signed   By: Jasmine Pang M.D.   On: 04/26/2019 19:58   US Pelvis Complete  Result Date: 04/26/2019 CLINICAL DATA:  Right lower quadrant pain EXAM: TRANSABDOMINAL AND TRANSVAGINAL ULTRASOUND OF PELVIS DOPPLER ULTRASOUND OF OVARIES TECHNIQUE: Both transabdominal and transvaginal ultrasound examinations of the pelvis were performed. Transabdominal technique was performed for global imaging of the pelvis including uterus, ovaries, adnexal regions, and pelvic cul-de-sac. It was necessary to proceed with endovaginal exam following the transabdominal exam to visualize the uterus endometrium ovaries. Color and duplex Doppler ultrasound was utilized to evaluate blood flow to the ovaries. COMPARISON:  CT 04/26/2019 FINDINGS: Uterus Measurements: 7 x 3.9 x 4.5 cm = volume: 63.6 mL. No fibroids or other mass visualized. Endometrium Thickness: 9.4 mm.  No focal abnormality visualized. Right ovary Measurements: 2.9 x 1.9 x 2.3 cm = volume: 6.5 mL. 8 mm possible hemorrhagic follicle. Left ovary Measurements: 2.6 x 1.6 x 2.2 cm = volume: 4.7 mL. Normal appearance/no adnexal mass. Pulsed Doppler evaluation of both ovaries demonstrates normal low-resistance arterial and venous waveforms. Other findings Trace free fluid IMPRESSION: 1. Negative for ovarian torsion or suspicious ovarian mass lesion. 2. Trace free fluid in the pelvis Electronically Signed   By: Jasmine Pang M.D.   On: 04/26/2019 19:58   Ct Abdomen Pelvis W Contrast  Result Date: 04/26/2019 CLINICAL DATA:  Right lower abdominal pain since last night. Hx of ovarian cyst EXAM: CT ABDOMEN AND PELVIS WITH CONTRAST TECHNIQUE: Multidetector CT imaging of the abdomen and pelvis was performed using the standard protocol following bolus administration of intravenous contrast. CONTRAST:   OMNIPAQUE IOHEXOL 300 MG/ML  SOLN COMPARISON:  None. FINDINGS: Lower chest: Minimal bibasilar atelectasis. No pleural effusion. Hepatobiliary: No focal liver abnormality is seen. No gallstones, gallbladder wall thickening, or biliary dilatation. Pancreas: Unremarkable. No pancreatic ductal dilatation or surrounding inflammatory changes. Spleen: Normal in size without focal abnormality. Adrenals/Urinary Tract: Adrenal glands are unremarkable. There is a 3 mm calculus in the inferior pole the left kidney. No hydronephrosis. No right renal calculi. No renal masses. Bladder is unremarkable. Stomach/Bowel: Stomach is within normal limits. Appendix appears normal. No evidence of bowel wall thickening, distention, or inflammatory changes. Vascular/Lymphatic: No significant vascular findings are present. No enlarged abdominal or pelvic lymph nodes. Reproductive: Uterus and bilateral adnexa are unremarkable. Other: No abdominal wall hernia or abnormality. No abdominopelvic ascites. Musculoskeletal: A few tiny sclerotic foci in the pelvis likely represent bone islands. No acute osseous finding. IMPRESSION: 1. No acute findings in the abdomen or pelvis to explain the patient's symptoms. 2. Nonobstructive left nephrolithiasis. Electronically Signed   By: Emmaline Kluver M.D.   On: 04/26/2019 17:11   [place] Art/ven Flow Abd Pelv Doppler  Result Date: 04/26/2019 CLINICAL DATA:  Right lower quadrant pain EXAM: TRANSABDOMINAL AND TRANSVAGINAL ULTRASOUND OF PELVIS DOPPLER ULTRASOUND OF OVARIES TECHNIQUE: Both transabdominal and transvaginal ultrasound examinations of the pelvis were performed. Transabdominal technique was performed for global imaging of the pelvis including uterus, ovaries, adnexal regions, and pelvic cul-de-sac. It was necessary to proceed with endovaginal exam following the transabdominal exam to visualize the uterus endometrium ovaries. Color and duplex Doppler ultrasound was utilized to evaluate blood flow to  the ovaries. COMPARISON:  CT 04/26/2019 FINDINGS: Uterus Measurements: 7 x 3.9 x 4.5 cm = volume: 63.6 mL. No fibroids or other mass visualized. Endometrium Thickness: 9.4 mm.  No focal abnormality visualized. Right ovary Measurements: 2.9 x 1.9 x 2.3 cm = volume: 6.5 mL. 8 mm possible hemorrhagic follicle. Left ovary Measurements: 2.6 x 1.6 x 2.2 cm = volume: 4.7 mL. Normal appearance/no adnexal mass. Pulsed Doppler evaluation of both ovaries demonstrates normal low-resistance arterial and venous waveforms. Other findings Trace free fluid IMPRESSION: 1. Negative for ovarian torsion or suspicious ovarian mass lesion. 2. Trace free fluid in the pelvis Electronically Signed   By: Jasmine PangKim  Fujinaga M.D.   On: 04/26/2019 19:58   MDM   Transferred [facility]hatri[facility].  See note for full HPI.  Patient presented for evaluation of abdominal pain worse to the right lower quadrant since yesterday.  She does have history of ovarian cysts.  States this feels similar.  Had negative CT scan which did not show any evidence of appendicitis or acute AP pathology.  She also had wet prep consistent with yeast, BV and many WBC.  No CMT tenderness however did have right adnexal tenderness on pelvic exam.  Labs and urinalysis negative for acute pathology.  She transferred for ultrasound to rule out torsion and pelvic pathology.  And if pain controlled and no acute findings DC home with pain medicine.  1900: Patient in US. Unable to assess.  2045: US with hemorrhagic follicle to RLQ.  On reevaluation pain significantly resolved. Tolerating PO intake without difficulty. Will dc home with pain meds and ObGyn follow up.   Patient is nontoxic, nonseptic appearing, in no apparent distress.  Patient's pain and other symptoms adequately managed in emergency department.  Fluid bolus given.  Labs, imaging and vitals reviewed.  Patient does not meet the SIRS or Sepsis criteria.  On repeat exam patient does not have  a surgical abdomin and there are no peritoneal signs.  No indication of appendicitis, bowel obstruction, bowel perforation, cholecystitis, diverticulitis, PID or ectopic pregnancy.  Patient discharged home with symptomatic treatment and given strict instructions for follow-up with their primary care physician.  I have also discussed reasons to return immediately to the ER.  Patient expresses understanding and agrees with plan.  The patient has been appropriately medically screened and/or stabilized in the ED. I have low suspicion for any other emergent medical condition which would require further screening, evaluation or treatment in the ED or require inpatient management.        Courtni Balash A, PA-C 04/26/19 2103    Raeford RazorKohut, Stephen, MD 04/27/19 1539

## 2019-04-26 NOTE — ED Notes (Signed)
Patient transported to CT 

## 2019-04-27 LAB — GC/CHLAMYDIA PROBE AMP (~~LOC~~) NOT AT ARMC
Chlamydia: NEGATIVE
Neisseria Gonorrhea: NEGATIVE

## 2019-05-28 ENCOUNTER — Other Ambulatory Visit: Payer: Self-pay

## 2019-05-28 DIAGNOSIS — Z20822 Contact with and (suspected) exposure to covid-19: Secondary | ICD-10-CM

## 2019-05-30 LAB — NOVEL CORONAVIRUS, NAA: SARS-CoV-2, NAA: NOT DETECTED

## 2021-03-01 IMAGING — CT CT ABD-PELV W/ CM
2 of 4 series · 16 of 46 positions shown, 18 images · IV contrast (APPLIED)
Comparison: None.

CLINICAL DATA: Right lower abdominal pain since last night. Hx of
ovarian cyst

EXAM:
CT ABDOMEN AND PELVIS WITH CONTRAST
TECHNIQUE: Multidetector CT imaging of the abdomen and pelvis was performed
using the standard protocol following bolus administration of
intravenous contrast.
CONTRAST:  100mL OMNIPAQUE IOHEXOL 300 MG/ML  SOLN

[Series 2: axial st · axial · 0.76mm/px · z∈[-221,+184]mm · 13 of 89 slices shown, 15 images]
[im 4/89  soft-tissue]
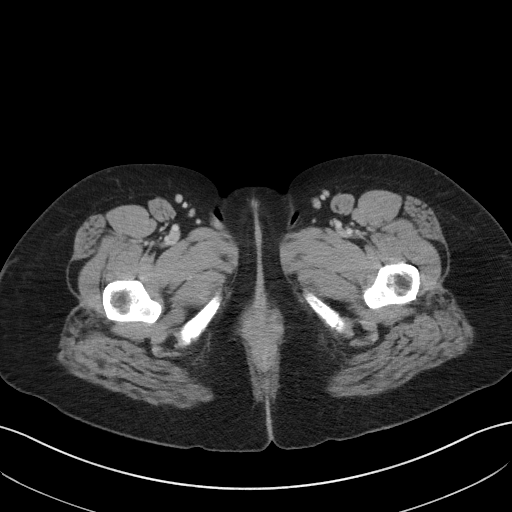
[im 4/89  bone]
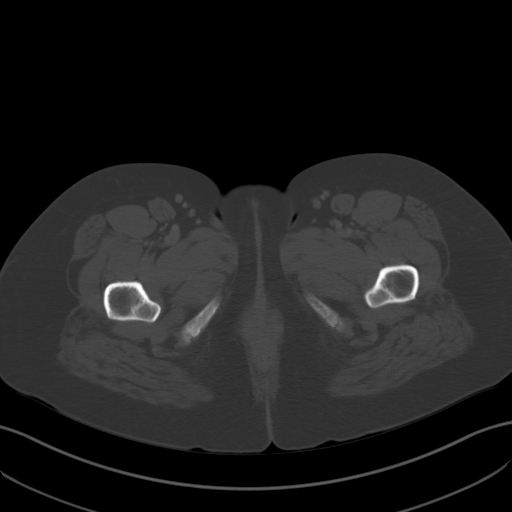
[im 11/89  soft-tissue]
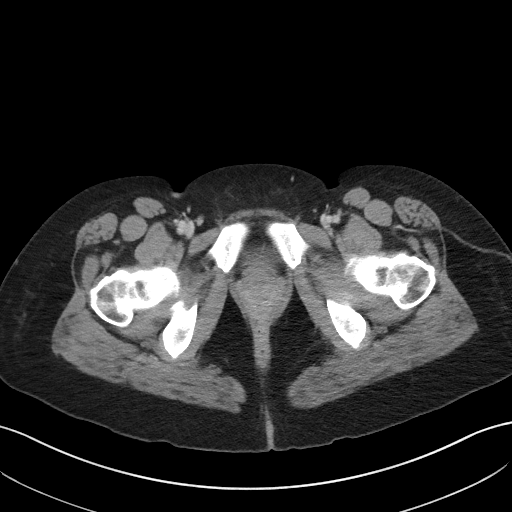
[im 18/89  soft-tissue]
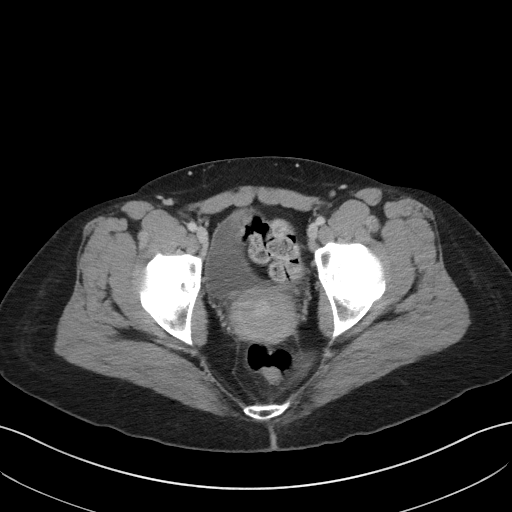
[im 25/89  soft-tissue]
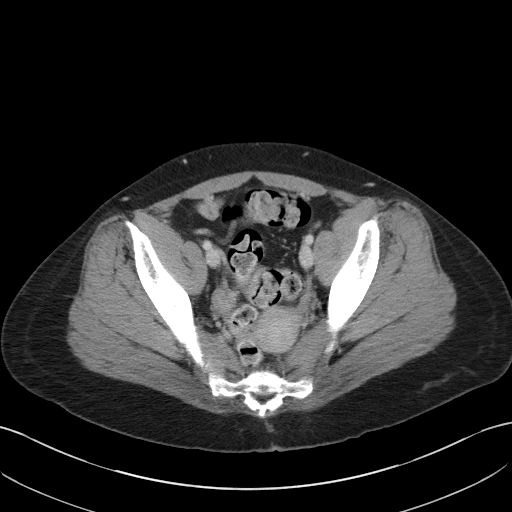
[im 32/89  soft-tissue]
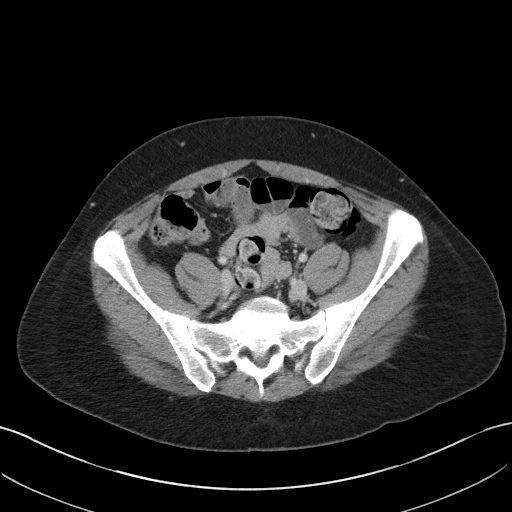
[im 39/89  soft-tissue]
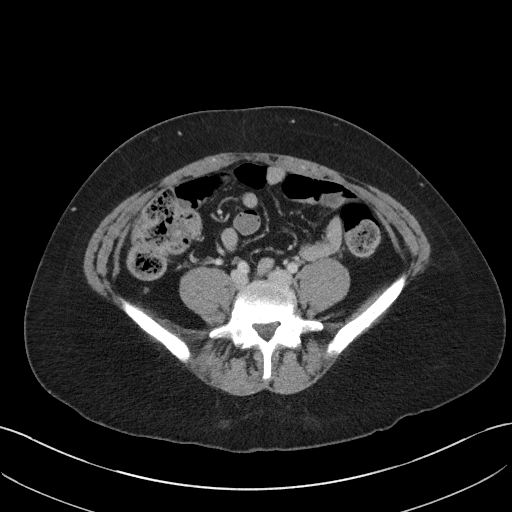
[im 46/89  soft-tissue]
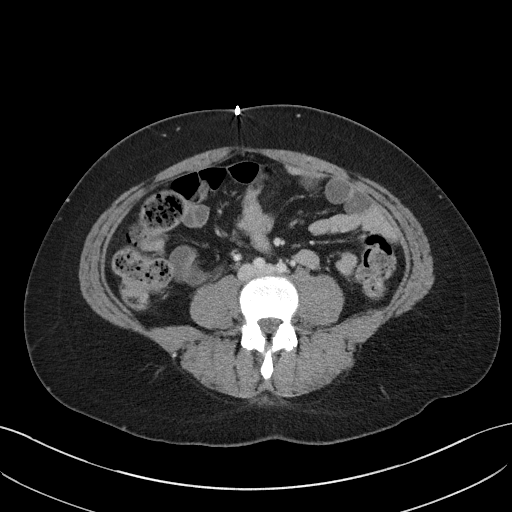
[im 50/89  soft-tissue]
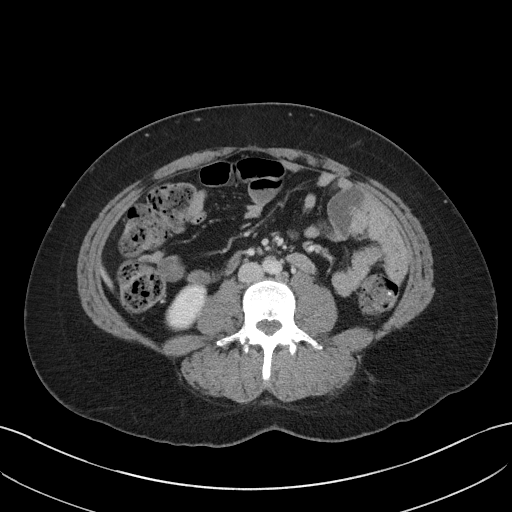
[im 57/89  soft-tissue]
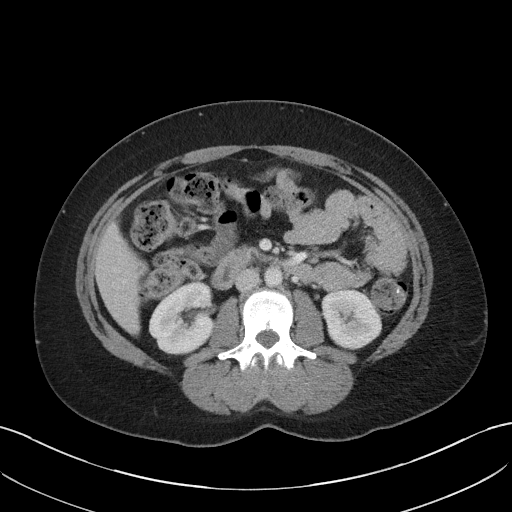
[im 57/89  bone]
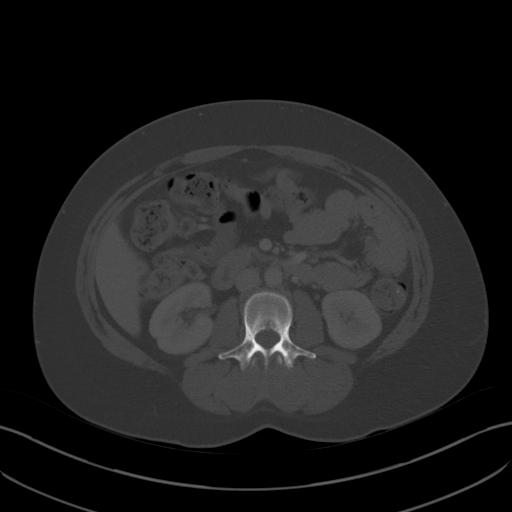
[im 64/89  soft-tissue]
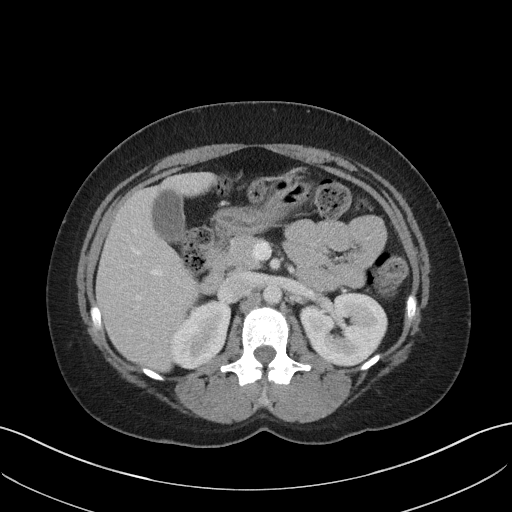
[im 71/89  soft-tissue]
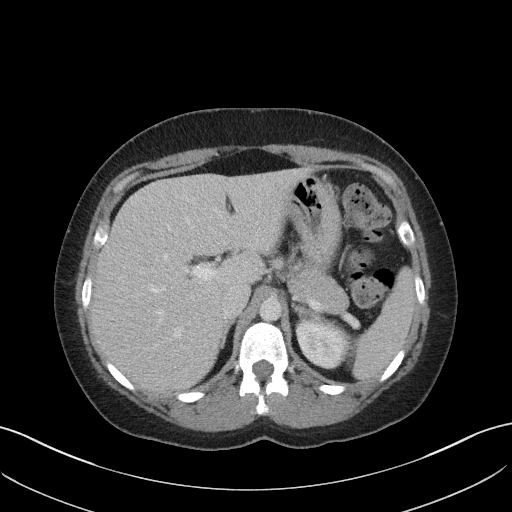
[im 78/89  soft-tissue]
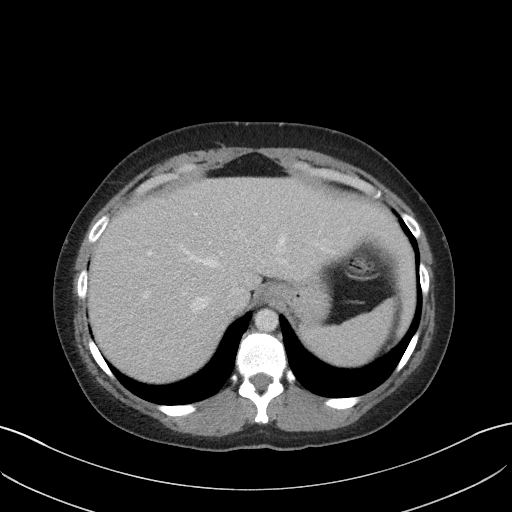
[im 85/89  soft-tissue]
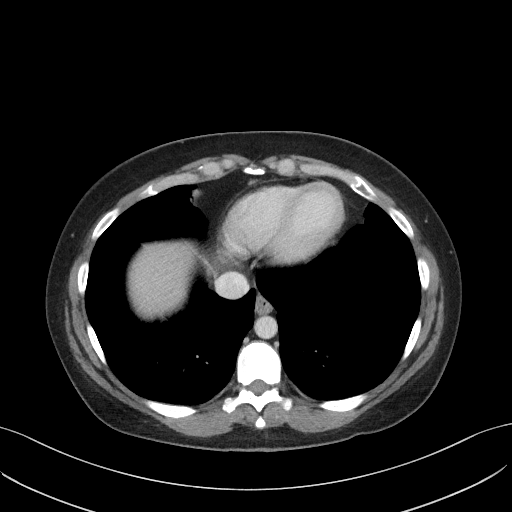

[Series 5: coronal st · coronal · 0.62mm/px · 3 of 101 slices shown]
[im 34/101  soft-tissue]
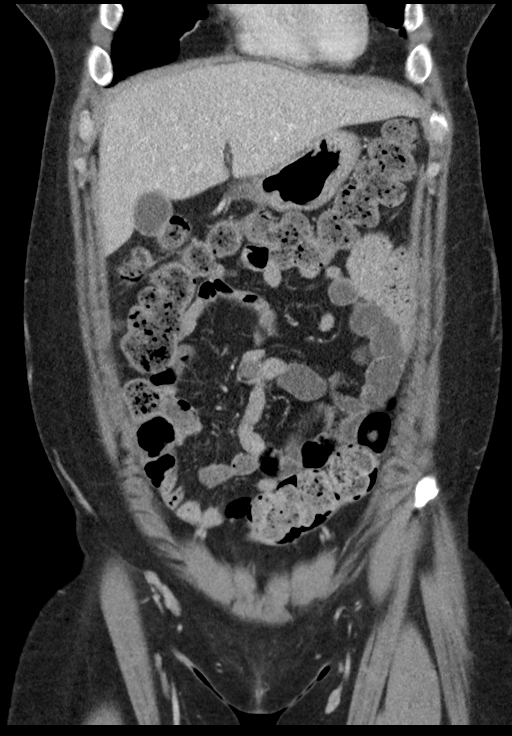
[im 45/101  soft-tissue]
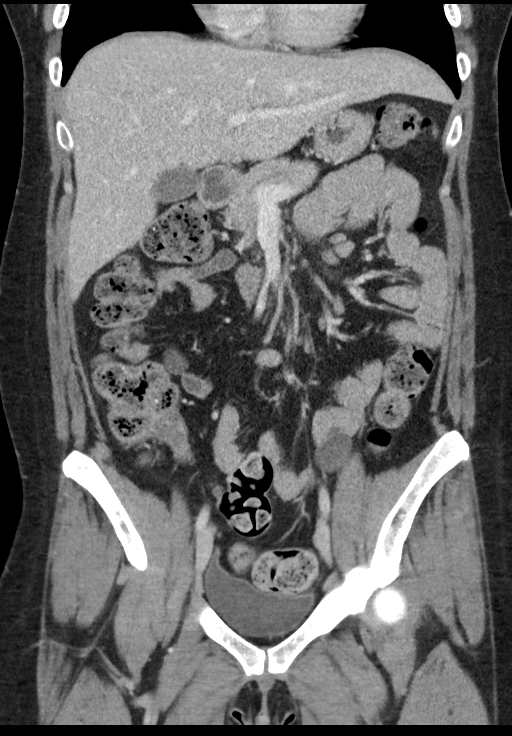
[im 56/101  soft-tissue]
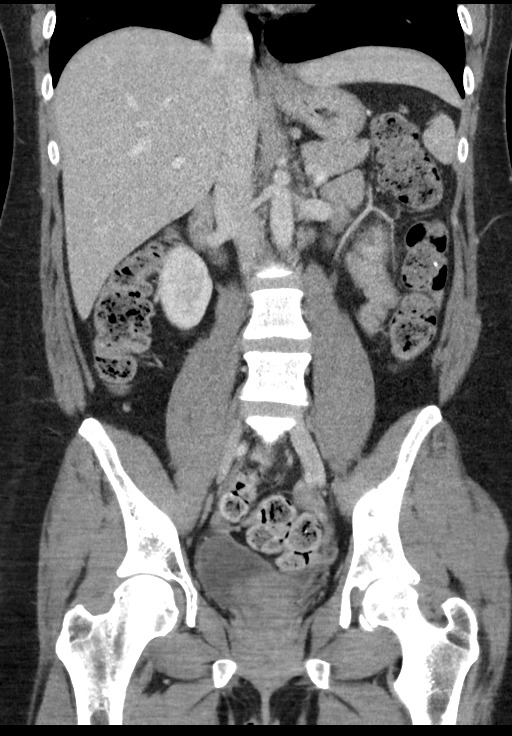

[16 of 46 positions shown; findings below may reference images not displayed]

FINDINGS: Lower chest: Minimal bibasilar atelectasis. No pleural effusion.

Hepatobiliary: No focal liver abnormality is seen. No gallstones,
gallbladder wall thickening, or biliary dilatation.

Pancreas: Unremarkable. No pancreatic ductal dilatation or
surrounding inflammatory changes.

Spleen: Normal in size without focal abnormality.

Adrenals/Urinary Tract: Adrenal glands are unremarkable. There is a
3 mm calculus in the inferior pole the left kidney. No
hydronephrosis. No right renal calculi. No renal masses. Bladder is
unremarkable.

Stomach/Bowel: Stomach is within normal limits. Appendix appears
normal. No evidence of bowel wall thickening, distention, or
inflammatory changes.

Vascular/Lymphatic: No significant vascular findings are present. No
enlarged abdominal or pelvic lymph nodes.

Reproductive: Uterus and bilateral adnexa are unremarkable.

Other: No abdominal wall hernia or abnormality. No abdominopelvic
ascites.

Musculoskeletal: A few tiny sclerotic foci in the pelvis likely
represent bone islands. No acute osseous finding.
IMPRESSION: 1. No acute findings in the abdomen or pelvis to explain the
patient's symptoms.
2. Nonobstructive left nephrolithiasis.

## 2021-03-01 IMAGING — US US ART/VEN ABD/PELV/SCROTUM DOPPLER LTD
1 series · 13 of 25 positions shown · non-contrast
Comparison: CT 04/26/2019

CLINICAL DATA: Right lower quadrant pain

EXAM:
TRANSABDOMINAL AND TRANSVAGINAL ULTRASOUND OF PELVIS
DOPPLER ULTRASOUND OF OVARIES
TECHNIQUE: Both transabdominal and transvaginal ultrasound examinations of the
pelvis were performed. Transabdominal technique was performed for
global imaging of the pelvis including uterus, ovaries, adnexal
regions, and pelvic cul-de-sac.
It was necessary to proceed with endovaginal exam following the
transabdominal exam to visualize the uterus endometrium ovaries.
Color and duplex Doppler ultrasound was utilized to evaluate blood
flow to the ovaries.

[Series 1: us art/ven abd/pelv/scrotum doppler ltd · 71 acquisitions, 13 frames shown]
[im 1/71]
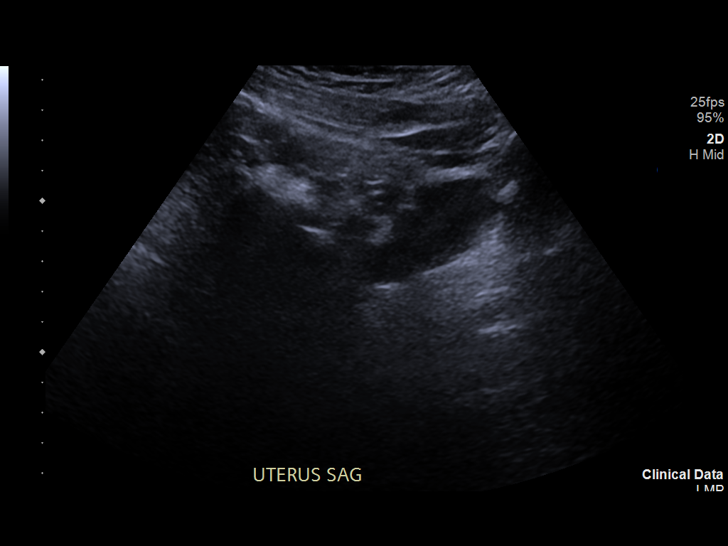
[im 6/71]
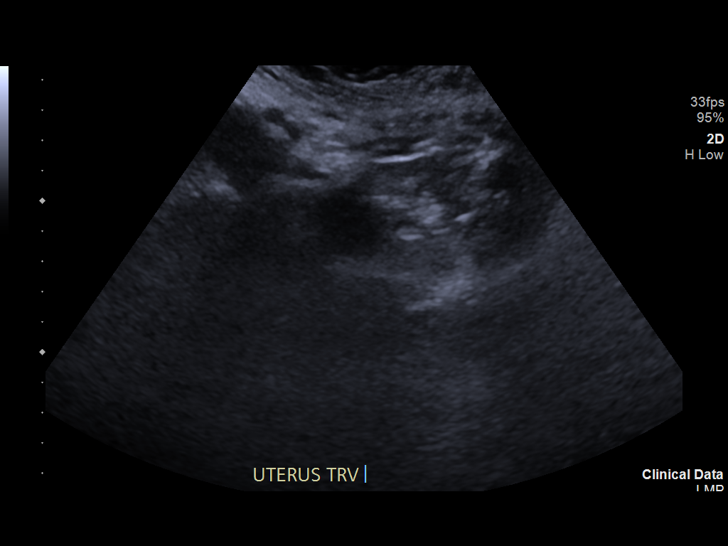
[im 12/71]
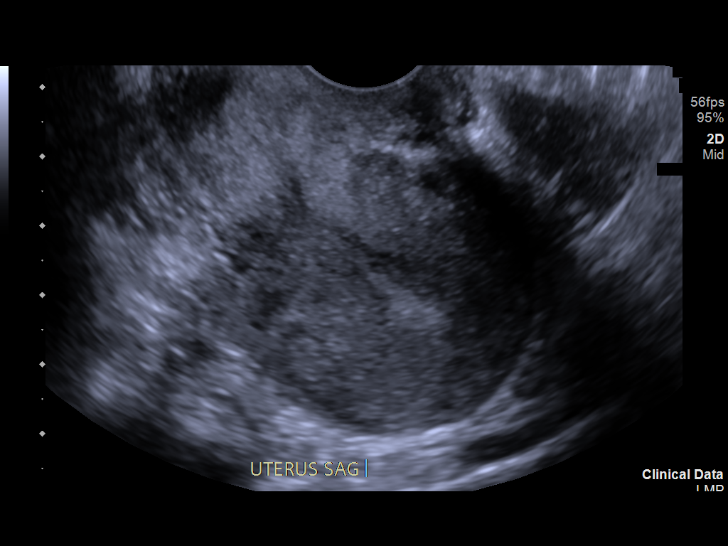
[im 18/71]
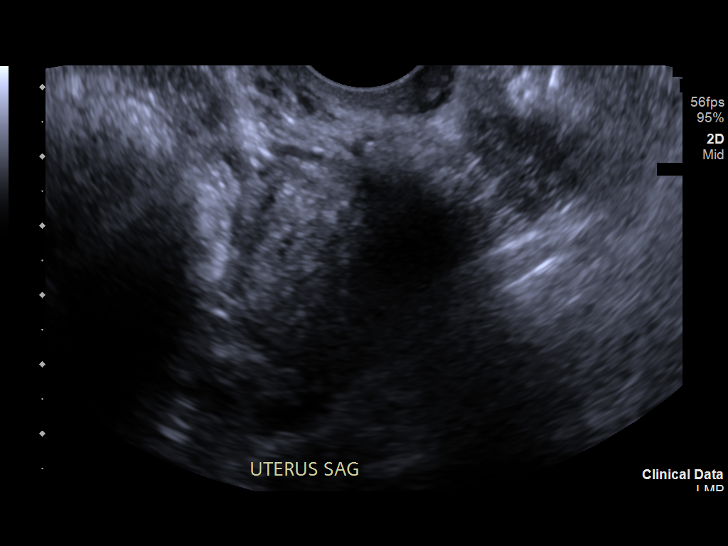
[im 24/71]
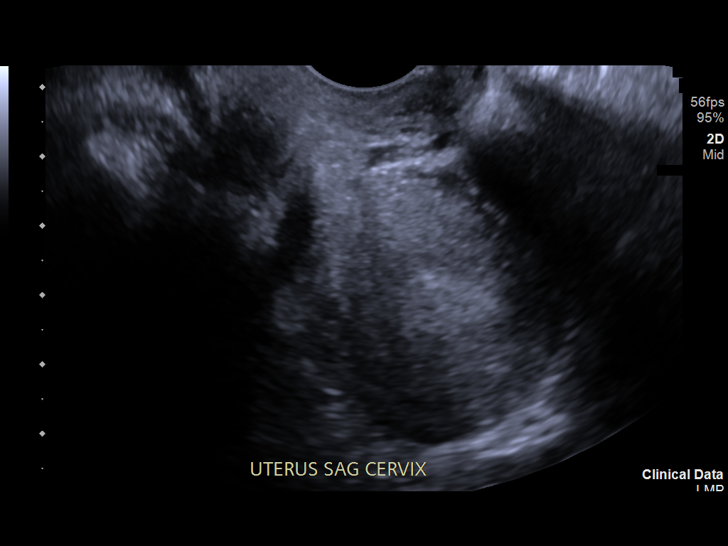
[im 30/71]
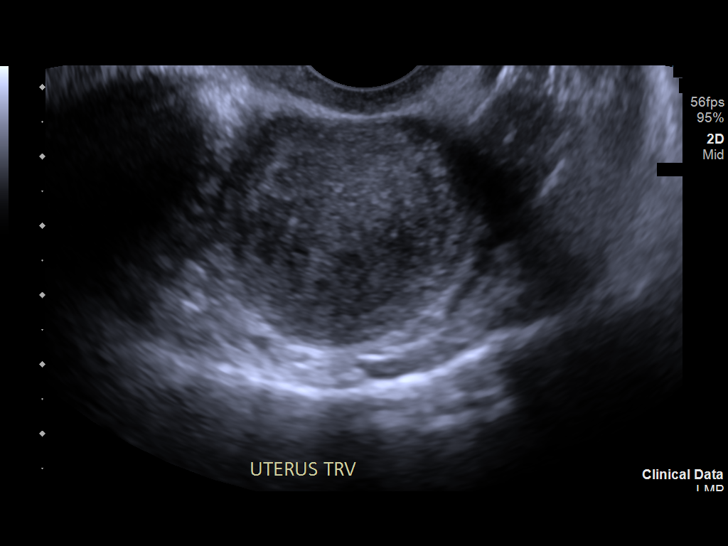
[im 36/71]
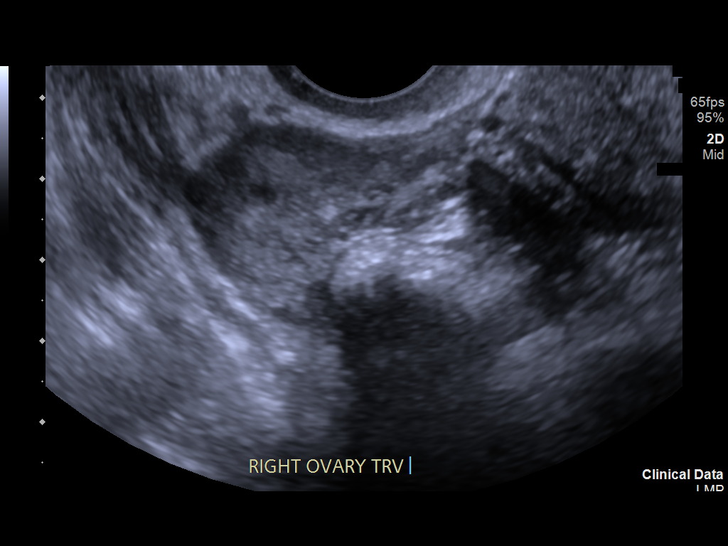
[im 41/71]
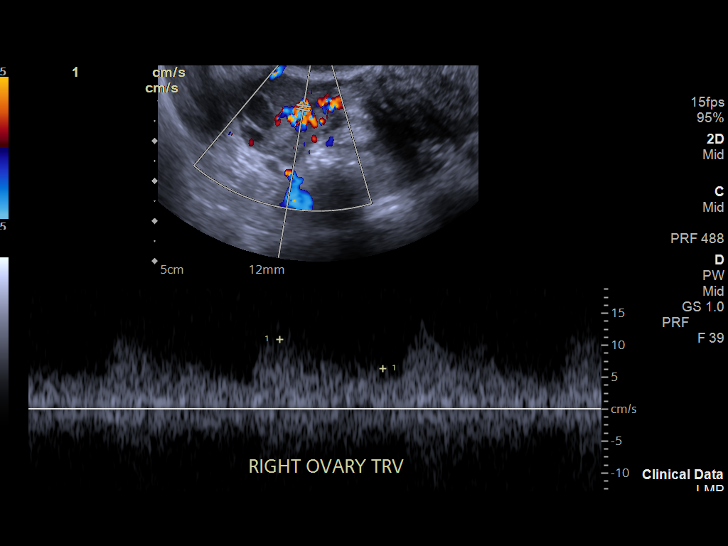
[im 47/71]
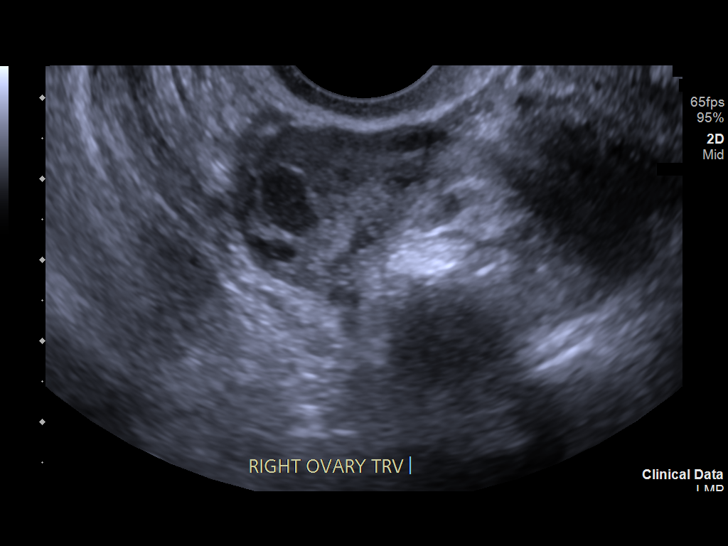
[im 53/71]
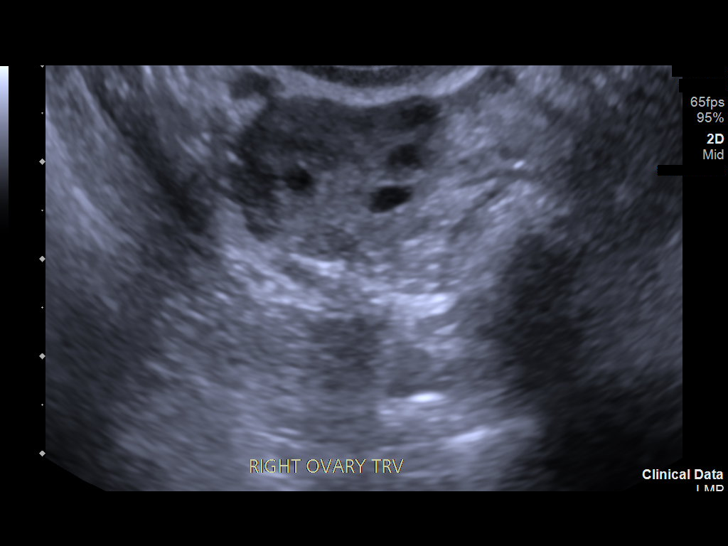
[im 59/71]
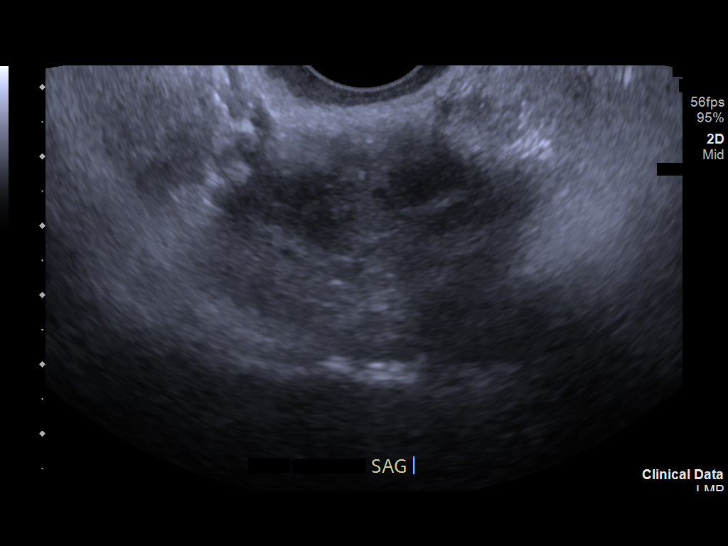
[im 65/71]
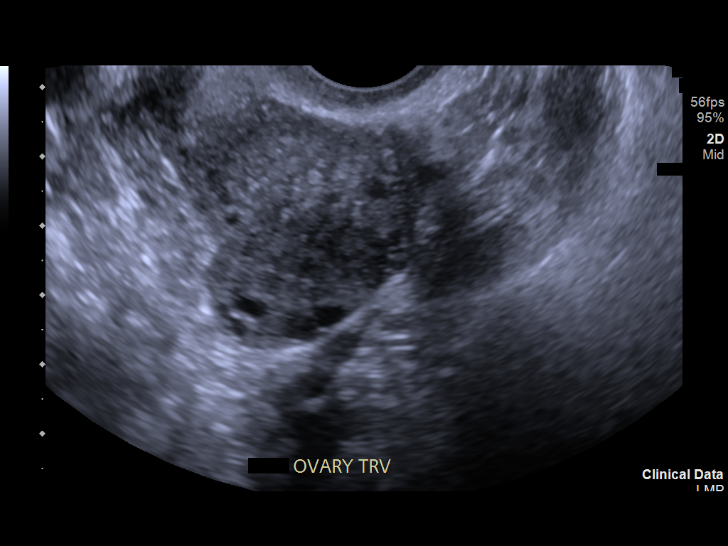
[im 71/71]
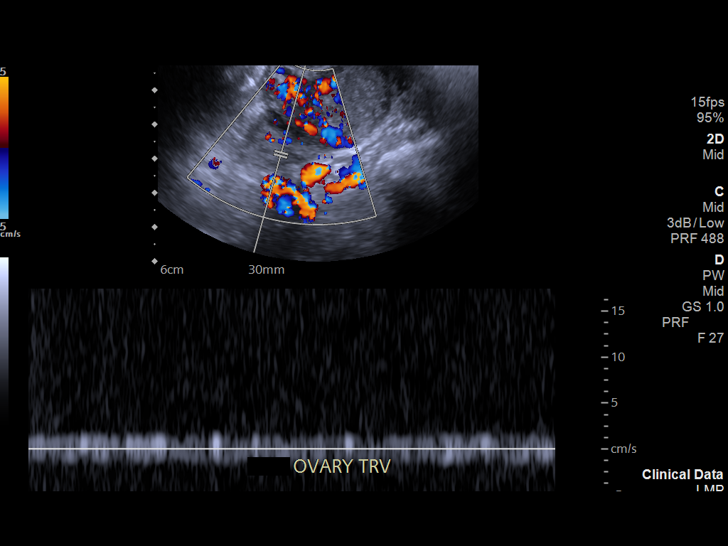

[13 of 25 positions shown; findings below may reference images not displayed]

FINDINGS: Uterus

Measurements: 7 x 3.9 x 4.5 cm = volume: 63.6 mL. No fibroids or
other mass visualized.

Endometrium

Thickness: 9.4 mm.  No focal abnormality visualized.

Right ovary

Measurements: 2.9 x 1.9 x 2.3 cm = volume: 6.5 mL. 8 mm possible
hemorrhagic follicle.

Left ovary

Measurements: 2.6 x 1.6 x 2.2 cm = volume: 4.7 mL. Normal
appearance/no adnexal mass.

Pulsed Doppler evaluation of both ovaries demonstrates normal
low-resistance arterial and venous waveforms.

Other findings

Trace free fluid
IMPRESSION: 1. Negative for ovarian torsion or suspicious ovarian mass lesion.
2. Trace free fluid in the pelvis

## 2021-03-10 ENCOUNTER — Encounter: Payer: Self-pay | Admitting: Emergency Medicine

## 2021-03-10 ENCOUNTER — Ambulatory Visit
Admission: EM | Admit: 2021-03-10 | Discharge: 2021-03-10 | Disposition: A | Discharge status: Home / Self Care | Payer: BC Managed Care – PPO | Attending: Urgent Care | Admitting: Urgent Care

## 2021-03-10 ENCOUNTER — Other Ambulatory Visit: Payer: Self-pay

## 2021-03-10 DIAGNOSIS — R300 Dysuria: Secondary | ICD-10-CM | POA: Insufficient documentation

## 2021-03-10 DIAGNOSIS — R350 Frequency of micturition: Secondary | ICD-10-CM | POA: Insufficient documentation

## 2021-03-10 DIAGNOSIS — N3000 Acute cystitis without hematuria: Secondary | ICD-10-CM

## 2021-03-10 DIAGNOSIS — R110 Nausea: Secondary | ICD-10-CM | POA: Insufficient documentation

## 2021-03-10 LAB — POCT URINALYSIS DIP (MANUAL ENTRY)
Bilirubin, UA: NEGATIVE
Glucose, UA: NEGATIVE mg/dL
Ketones, POC UA: NEGATIVE mg/dL
Leukocytes, UA: NEGATIVE
Nitrite, UA: NEGATIVE
Protein Ur, POC: NEGATIVE mg/dL
Spec Grav, UA: 1.01 (ref 1.010–1.025)
Urobilinogen, UA: 0.2 E.U./dL
pH, UA: 7 (ref 5.0–8.0)

## 2021-03-10 LAB — POCT URINE PREGNANCY: Preg Test, Ur: NEGATIVE

## 2021-03-10 MED ORDER — NITROFURANTOIN MONOHYD MACRO 100 MG PO CAPS: 100.0000 mg | ORAL_CAPSULE | Freq: Two times a day (BID) | ORAL | 0 refills | Status: DC

## 2021-03-10 NOTE — Discharge Instructions (Addendum)

## 2021-03-10 NOTE — ED Triage Notes (Signed)
Pt sts dysuria and left sided back pain starting yesterday; pt sts one episode of vomiting

## 2021-03-10 NOTE — ED Provider Notes (Signed)
[facility]   MRN: [id number] DOB: 10/22/1985  Subjective:   Diana Meyer is a 35 y.o. female presenting for 1 day history of acute onset dysuria, urinary frequency, nausea without vomiting, left-sided flank tenderness.  Denies fever, frank hematuria, vaginal discharge, concern for STI.  Patient has a remote history of renal stones.  She was a teenager but states this feels different.  Her primary concern is to rule out UTI.  She has been hydrating very well and using Tylenol, ibuprofen for pain.  She has a follow-up scheduled soon with her gynecologist.  No current facility-administered medications for this encounter.  Current Outpatient Medications:    acetaminophen (TYLENOL) 325 MG tablet, Take 650 mg by mouth every 6 (six) hours as needed for mild pain., Disp: , Rfl:    dicyclomine (BENTYL) 20 MG tablet, Take 1 tablet (20 mg total) by mouth every 8 (eight) hours as needed for spasms (and abdominal cramping)., Disp: 20 tablet, Rfl: 0   diphenhydrAMINE (BENADRYL) 25 MG tablet, Take 25 mg by mouth every 6 (six) hours as needed for allergies., Disp: , Rfl:    HYDROcodone-acetaminophen (NORCO/VICODIN) 5-325 MG tablet, Take 1 tablet by mouth every 4 (four) hours as needed. (Patient not taking: Reported on 03/10/2021), Disp: 10 tablet, Rfl: 0   ibuprofen (ADVIL,MOTRIN) 200 MG tablet, Take 200 mg by mouth every 6 (six) hours as needed for moderate pain., Disp: , Rfl:    lamoTRIgine (LAMICTAL) 100 MG tablet, Take 300 mg by mouth daily., Disp: , Rfl:    ondansetron (ZOFRAN ODT) 4 MG disintegrating tablet, Take 1 tablet (4 mg total) by mouth every 8 (eight) hours as needed for nausea or vomiting., Disp: 20 tablet, Rfl: 0   ondansetron (ZOFRAN) 4 MG tablet, Take 1 tablet (4 mg total) by mouth every 8 (eight) hours as needed for nausea or vomiting., Disp: 8 tablet, Rfl: 0   oxyCODONE (ROXICODONE) 5 MG immediate release tablet, Take 1 tablet (5 mg total) by mouth every 12 (twelve)  hours as needed for severe pain. (Patient not taking: Reported on 03/10/2021), Disp: 8 tablet, Rfl: 0   No Known Allergies  History reviewed. No pertinent past medical history.   Past Surgical History:  Procedure Laterality Date   WISDOM TOOTH EXTRACTION      History reviewed. No pertinent family history.  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    Appearance: Normal appearance. She is well-developed. She is not ill-appearing, toxic-appearing or diaphoretic.  HENT:     Head: Normocephalic and atraumatic.     Nose: Nose normal.     Mouth/Throat:     Mouth: Mucous membranes are moist.     Pharynx: Oropharynx is clear.  Eyes:  General: No scleral icterus.       Right eye: No discharge.        Left eye: No discharge.     Extraocular Movements: Extraocular movements intact.     Conjunctiva/sclera: Conjunctivae normal.     Pupils: Pupils are equal, round, and reactive to light.  Cardiovascular:     Rate and Rhythm: Normal rate.  Pulmonary:     Effort: Pulmonary effort is normal.  Abdominal:     General: Bowel sounds are normal. There is no distension.     Palpations: Abdomen is soft. There is no mass.     Tenderness: no abdominal tenderness There is no right CVA tenderness, left CVA tenderness, guarding or rebound.  Skin:    General: Skin is warm and dry.  Neurological:     General: No focal deficit present.     Mental  Status: She is alert and oriented to person, place, and time.  Psychiatric:        Mood and Affect: Mood normal.        Behavior: Behavior normal.        Thought Content: Thought content normal.        Judgment: Judgment normal.    Results for orders placed or performed during the hospital encounter of 03/10/21 (from the past 24 hour(s))  POCT urinalysis dipstick     Status: Abnormal   Collection Time: 03/10/21  1:54 PM  Result Value Ref Range   Color, UA yellow yellow   Clarity, UA clear clear   Glucose, UA negative negative mg/dL   Bilirubin, UA negative negative   Ketones, POC UA negative negative mg/dL   Spec Grav, UA 1.025 8.527 - 1.025   Blood, UA trace-intact (A) negative   pH, UA 7.0 5.0 - 8.0   Protein Ur, POC negative negative mg/dL   Urobilinogen, UA 0.2 0.2 or 1.0 E.U./dL   Nitrite, UA Negative Negative   Leukocytes, UA Negative Negative  POCT urine pregnancy     Status: None   Collection Time: 03/10/21  1:57 PM  Result Value Ref Range   Preg Test, Ur Negative Negative    Assessment and Plan :   PDMP not reviewed this encounter.  1. Acute cystitis without hematuria   2. Dysuria   3. Nausea without vomiting   4. Urinary frequency     Start Macrobid to cover for mild acute cystitis, urine culture pending.  Recommended aggressive hydration, limiting urinary irritants. Counseled patient on potential for adverse effects with medications prescribed/recommended today, ER and return-to-clinic precautions discussed, patient verbalized understanding.    Wallis Bamberg, PA-C 03/10/21 1409

## 2021-03-12 LAB — URINE CULTURE: Culture: 10000 — AB

## 2022-09-18 ENCOUNTER — Ambulatory Visit (HOSPITAL_COMMUNITY): Payer: Self-pay | Admitting: Physician Assistant

## 2022-09-20 ENCOUNTER — Encounter (HOSPITAL_COMMUNITY): Payer: Self-pay

## 2022-09-20 ENCOUNTER — Ambulatory Visit (HOSPITAL_COMMUNITY): Payer: Self-pay | Admitting: Physician Assistant

## 2023-01-29 ENCOUNTER — Encounter: Payer: Self-pay | Admitting: Physician Assistant

## 2023-01-29 ENCOUNTER — Ambulatory Visit: Payer: Medicaid Other | Admitting: Physician Assistant

## 2023-01-29 VITALS — BP 118/76 | HR 79 | Temp 98.1°F | Resp 20 | Ht 62.0 in | Wt 149.0 lb

## 2023-01-29 DIAGNOSIS — Z720 Tobacco use: Secondary | ICD-10-CM

## 2023-01-29 DIAGNOSIS — M79642 Pain in left hand: Secondary | ICD-10-CM | POA: Diagnosis not present

## 2023-01-29 DIAGNOSIS — F1910 Other psychoactive substance abuse, uncomplicated: Secondary | ICD-10-CM

## 2023-01-29 DIAGNOSIS — F319 Bipolar disorder, unspecified: Secondary | ICD-10-CM

## 2023-01-29 MED ORDER — LAMOTRIGINE 200 MG PO TABS: 400.0000 mg | ORAL_TABLET | Freq: Every day | ORAL | 0 refills | Status: AC

## 2023-01-29 MED ORDER — GABAPENTIN 300 MG PO CAPS
300.0000 mg | ORAL_CAPSULE | Freq: Three times a day (TID) | ORAL | 0 refills | Status: AC
Start: 2023-01-29 — End: ?

## 2023-01-29 NOTE — Progress Notes (Signed)
New patient visit   Patient: Diana Meyer   DOB: [birth year]   37 y.o. Female  MRN: [id number] Visit Date: 01/29/2023  Today's healthcare provider: Alfredia Ferguson, PA-C   Chief Complaint  Patient presents with   Referral    Psychology referral    Medication Refill   Subjective    Diana Meyer is a 37 y.o. female who presents today as a new patient to establish care.  HPI  Discussed the use of AI scribe software for clinical note transcription with the patient, who gave verbal consent to proceed.  History of Present Illness   The patient, with a history of bipolar disorder and substance abuse, presents today seeking a new primary care provider and a referral to a psychologist. They recently discovered the identity of their biological parents, which has caused significant emotional distress and they feel the need for psychological support to process this information. They have never seen a psychologist before, only counselors, and have not been consistent with a doctor in the past.  The patient has been on Lamictal for bipolar disorder for approximately five years and reports it generally works well for them, except for the week before their menstrual cycle when they experience significant mood swings. They also take Valium as needed for anxiety, which has been more frequent recently due to their current emotional distress. The patient's father, whom she lives with currently, keeps the Valium in a safe to prevent misuse.  The patient has been clean from substance abuse for five months and is currently on Suboxone for maintenance. They have a history of heroin, cocaine, amphetamine use and believe Suboxone has been instrumental in their recovery. They are currently involved in a recovery program and attend counseling sessions weekly.  The patient also reports nerve pain in their hands and a shoulder issue that causes their shoulder to pop in and out. They have been taking  gabapentin for this nerve pain.   They also report high cholesterol from recent blood work from their last PCP.        History reviewed. No pertinent past medical history. Past Surgical History:  Procedure Laterality Date   WISDOM TOOTH EXTRACTION     No family status information on file.   History reviewed. No pertinent family history. Social History   Socioeconomic History   Marital status: Single    Spouse name: Not on file   Number of children: Not on file   Years of education: Not on file   Highest education level: Not on file  Occupational History   Not on file  Tobacco Use   Smoking status: Every Day    Current packs/day: 1.00    Types: Cigarettes   Smokeless tobacco: Never  Vaping Use   Vaping status: Never Used  Substance and Sexual Activity   Alcohol use: Yes    Comment: "not really on a regular basis"   Drug use: Yes    Types: Marijuana    Comment: "I've porobably done a little bit of everything." Reports last IV drug use >5 yrs    Sexual activity: Not on file  Other Topics Concern   Not on file  Social History Narrative   Not on file   Social Determinants of Health   Financial Resource Strain: Not on file  Food Insecurity: Not on file  Transportation Needs: Not on file  Physical Activity: Not on file  Stress: Not on file  Social Connections: Not on file   Outpatient Medications  Prior to Visit  Medication Sig   acetaminophen (TYLENOL) 325 MG tablet Take 650 mg by mouth every 6 (six) hours as needed for mild pain.   buprenorphine-naloxone (SUBOXONE) 8-2 mg SUBL SL tablet Place 2 tablets under the tongue daily.   diazepam (VALIUM) 5 MG tablet Take 5 mg by mouth as needed for anxiety.   diphenhydrAMINE (BENADRYL) 25 MG tablet Take 25 mg by mouth every 6 (six) hours as needed for allergies.   ibuprofen (ADVIL,MOTRIN) 200 MG tablet Take 200 mg by mouth every 6 (six) hours as needed for moderate pain.   [DISCONTINUED] lamoTRIgine (LAMICTAL) 100 MG  tablet Take 300 mg by mouth daily.   [DISCONTINUED] ondansetron (ZOFRAN ODT) 4 MG disintegrating tablet Take 1 tablet (4 mg total) by mouth every 8 (eight) hours as needed for nausea or vomiting.   [DISCONTINUED] ondansetron (ZOFRAN) 4 MG tablet Take 1 tablet (4 mg total) by mouth every 8 (eight) hours as needed for nausea or vomiting.   [DISCONTINUED] dicyclomine (BENTYL) 20 MG tablet Take 1 tablet (20 mg total) by mouth every 8 (eight) hours as needed for spasms (and abdominal cramping). (Patient not taking: Reported on 01/29/2023)   [DISCONTINUED] HYDROcodone-acetaminophen (NORCO/VICODIN) 5-325 MG tablet Take 1 tablet by mouth every 4 (four) hours as needed. (Patient not taking: Reported on 03/10/2021)   [DISCONTINUED] nitrofurantoin, macrocrystal-monohydrate, (MACROBID) 100 MG capsule Take 1 capsule (100 mg total) by mouth 2 (two) times daily. (Patient not taking: Reported on 01/29/2023)   [DISCONTINUED] oxyCODONE (ROXICODONE) 5 MG immediate release tablet Take 1 tablet (5 mg total) by mouth every 12 (twelve) hours as needed for severe pain. (Patient not taking: Reported on 03/10/2021)   No facility-administered medications prior to visit.   No Known Allergies  Immunization History  Administered Date(s) Administered   Tdap 05/30/2017    Health Maintenance  Topic Date Due   HIV Screening  Never done   Hepatitis C Screening  Never done   PAP SMEAR-Modifier  Never done   COVID-19 Vaccine (2 - 2023-24 season) 03/08/2022   INFLUENZA VACCINE  02/06/2023   DTaP/Tdap/Td (2 - Td or Tdap) 05/31/2027   HPV VACCINES  Aged Out    Patient Care Team: Alfredia Ferguson, PA-C as PCP - General (Physician Assistant)    Objective    BP 118/76 (BP Location: Left Arm, Patient Position: Sitting, Cuff Size: Normal)   Pulse 79   Temp 98.1 F (36.7 C)   Resp 20   Ht 5\' 2"  (1.575 m)   Wt 149 lb (67.6 kg)   LMP 01/13/2023 (Exact Date)   SpO2 97%   BMI 27.25 kg/m   Physical Exam Constitutional:       General: She is awake.     Appearance: She is well-developed.  HENT:     Head: Normocephalic.  Eyes:     Conjunctiva/sclera: Conjunctivae normal.  Cardiovascular:     Rate and Rhythm: Normal rate and regular rhythm.     Heart sounds: Normal heart sounds.  Pulmonary:     Effort: Pulmonary effort is normal.     Breath sounds: Normal breath sounds.  Skin:    General: Skin is warm.  Neurological:     Mental Status: She is alert and oriented to person, place, and time.  Psychiatric:        Attention and Perception: Attention normal.        Mood and Affect: Mood normal.        Speech: Speech normal.  Behavior: Behavior is cooperative.    Depression Screen    01/29/2023    8:27 AM  PHQ 2/9 Scores  PHQ - 2 Score 6  PHQ- 9 Score 16   No results found for any visits on 01/29/23.  Assessment & Plan      Problem List Items Addressed This Visit       Other   Bipolar disorder (HCC) - Primary     On Lamictal 400mg  daily. Pt is compliant. To avoid lapses in treatment, will refill. Advised not refilling valium currently, glad patient's father keeps medication, but I would avoid use with substance use history -Referral to Psychiatry for further evaluation and management.      Relevant Medications   lamoTRIgine (LAMICTAL) 200 MG tablet   Other Relevant Orders   Ambulatory referral to Psychiatry   Ambulatory referral to Psychology   Polysubstance abuse (HCC)    Clean for 5 months, on Suboxone maintenance therapy. Active participation in [facility] Stop program and Narcotics Anonymous.        Tobacco use    Reports smoking for 20 years, interested in cessation. -Encourage continued participation in smoking cessation program through [facility] stop.          Left hand pain    Reports nerve pain in left hand and arm, worse at night, suggestive of carpal tunnel syndrome. -Recommend carpal tunnel brace, particularly for night use. -Continue Gabapentin 300mg  TID for nerve pain for now  with goal to diminish use. -Will obtain recent labs to check gfr/creat       Relevant Medications   gabapentin (NEURONTIN) 300 MG capsule     Return in about 6 months (around 08/01/2023) for CPE.     I, Alfredia Ferguson, PA-C have reviewed all documentation for this visit. The documentation on  01/29/23   for the exam, diagnosis, procedures, and orders are all accurate and complete.  Alfredia Ferguson, PA-C  [facility] at [facility] [phone] (phone) [phone] (fax)  [facility]

## 2023-01-29 NOTE — Assessment & Plan Note (Addendum)
Clean for 5 months, on Suboxone maintenance therapy. Active participation in [facility] Stop program and Narcotics Anonymous.

## 2023-01-29 NOTE — Assessment & Plan Note (Addendum)
  On Lamictal 400mg  daily. Pt is compliant. To avoid lapses in treatment, will refill. Advised not refilling valium currently, glad patient's father keeps medication, but I would avoid use with substance use history -Referral to Psychiatry for further evaluation and management.

## 2023-01-29 NOTE — Assessment & Plan Note (Signed)
Reports nerve pain in left hand and arm, worse at night, suggestive of carpal tunnel syndrome. -Recommend carpal tunnel brace, particularly for night use. -Continue Gabapentin 300mg  TID for nerve pain for now with goal to diminish use. -Will obtain recent labs to check gfr/creat

## 2023-01-29 NOTE — Patient Instructions (Signed)
Emergency Mental Health Services:  Crisis Hotline: 41  HandlingCost.fr  Substance Abuse and Mental Health Services Administration [facility]) Hotline:  7157304211 (478)[phone])  [facility] (like an urgent care for mental health) [address], [place], [place] [address] [phone]  Therapy/ Psychiatry Offices:  [facility] at [facility] [address] [place],  [place]  [address] [phone] [email or web address]  [facility] [address]  [place], [place] [address] [phone] [email or web address]  [facility] [address] [place], [place] [address] [phone] [email or web address]  [facility]  [address], [place] [address] [phone] [email or web address]  Dr. Milagros Evener  [address] [place] [address] [phone]       [email or web address]  [facility]  [address] [place] [address]. [phone] [email or web address]  [facility] [address] [place], [place] [address] [phone]  [address] [place], [place] [address] [phone] [email or web address]  [facility] [address] [place], [place] [address] [phone] [email or web address]

## 2023-01-29 NOTE — Assessment & Plan Note (Addendum)
Reports smoking for 20 years, interested in cessation. -Encourage continued participation in smoking cessation program through [facility] stop.
